# Patient Record
Sex: Male | Born: 1950
Health system: Southern US, Community
[De-identification: ages and names within clinical notes are randomized; demographics above are authoritative.]

## PROBLEM LIST (undated history)

## (undated) HISTORY — PX: APPENDECTOMY: SHX54

---

## 2007-09-28 ENCOUNTER — Emergency Department (HOSPITAL_COMMUNITY): Admission: EM | Admit: 2007-09-28 | Discharge: 2007-09-28 | Payer: Self-pay | Admitting: Emergency Medicine

## 2010-11-28 LAB — URINALYSIS, ROUTINE W REFLEX MICROSCOPIC
Bilirubin Urine: NEGATIVE
Glucose, UA: NEGATIVE
Hgb urine dipstick: NEGATIVE
Ketones, ur: 15 — AB
Nitrite: NEGATIVE
Protein, ur: NEGATIVE
Specific Gravity, Urine: 1.005
Urobilinogen, UA: 0.2
pH: 7

## 2010-11-28 LAB — POCT I-STAT, CHEM 8
BUN: 15
Calcium, Ion: 1.15
Chloride: 105
Creatinine, Ser: 1
Glucose, Bld: 113 — ABNORMAL HIGH
HCT: 43
Hemoglobin: 14.6
Potassium: 3.4 — ABNORMAL LOW
Sodium: 140
TCO2: 25

## 2016-01-03 ENCOUNTER — Ambulatory Visit (INDEPENDENT_AMBULATORY_CARE_PROVIDER_SITE_OTHER): Payer: Self-pay | Admitting: Physician Assistant

## 2016-01-03 VITALS — BP 130/80 | HR 83 | Temp 98.2°F | Resp 17 | Ht 68.5 in | Wt 212.0 lb

## 2016-01-03 DIAGNOSIS — Z0289 Encounter for other administrative examinations: Secondary | ICD-10-CM

## 2016-01-03 NOTE — Patient Instructions (Signed)
     IF you received an x-ray today, you will receive an invoice from [facility]. Please contact [facility] at [phone] with questions or concerns regarding your invoice.   IF you received labwork today, you will receive an invoice from [facility]. Please contact [facility] at [phone] with questions or concerns regarding your invoice.   Our billing staff will not be able to assist you with questions regarding bills from these companies.  You will be contacted with the lab results as soon as they are available. The fastest way to get your results is to activate your My Chart account. Instructions are located on the last page of this paperwork. If you have not heard from us regarding the results in 2 weeks, please contact this office.      

## 2016-01-03 NOTE — Progress Notes (Signed)
Commercial Driver Medical Examination   Joel Orozco is a 65 y.o. male who presents today for a commercial driver fitness determination physical exam. The patient reports no problems today. In the past the patient reports receiving 2 year certificates. He denies focal neurological deficits, vision and hearing changes. He denies the habitual use of benzodiazepines and opioids.  No past medical history on file.  Current medications, family history, allergies, social history reviewed by me and exist elsewhere in the encounter.   Review of Systems  Constitutional: Negative for fever.  Respiratory: Negative for shortness of breath.   Cardiovascular: Negative for chest pain and leg swelling.  Skin: Negative for rash.  Neurological: Negative for dizziness, tingling, tremors, sensory change, speech change, focal weakness, seizures, loss of consciousness and headaches.    Objective:     Vision/hearing:  Visual Acuity Screening   Right eye Left eye Both eyes  Without correction:     With correction: 20/13 20/15 20/13   Comments: Peripheral Vision: Right eye 85  degrees. Left eye 85 degrees.The patient can distinguish the colors red, amber and green.  Hearing Screening Comments: The patient was able to hear a forced whisper from 10 feet.  Urine: 1.015, negative for sugar, blood, protein  Applicant can recognize and distinguish among traffic control signals and devices showing standard red, green, and amber colors.  Corrective lenses required: Yes  Monocular Vision?: No  Hearing aid requirement: No  Physical Exam  Constitutional: He is oriented to person, place, and time. He appears well-developed and well-nourished. No distress.  HENT:  Right Ear: External ear normal.  Left Ear: External ear normal.  Nose: Nose normal.  Mouth/Throat: Oropharynx is clear and moist. No oropharyngeal exudate.  Eyes: Conjunctivae and EOM are normal. Pupils are equal, round, and reactive to light.   Cardiovascular: Normal rate, regular rhythm and normal heart sounds.   Pulmonary/Chest: Effort normal and breath sounds normal. He has no wheezes. He has no rales.  Musculoskeletal: Normal range of motion. He exhibits no edema, tenderness or deformity.  Neurological: He is alert and oriented to person, place, and time. He displays normal reflexes. No cranial nerve deficit. He exhibits normal muscle tone. Coordination normal.  Skin: Skin is warm and dry. He is not diaphoretic.  Psychiatric: He has a normal mood and affect.    BP 130/80 (BP Location: Left Arm, Cuff Size: Large)   Pulse 83   Temp 98.2 F (36.8 C) (Oral)   Resp 17   Ht 5' 8.5" (1.74 m)   Wt 212 lb (96.2 kg)   SpO2 97%   BMI 31.77 kg/m   Labs: Comments: UA: SG-1.015, Glu-Neg, Blo-Neg, Pro-Neg  Assessment:    Healthy male exam.  Meets standards in 84 CFR 391.41;  qualifies for 2 year certificate.    Plan:    Medical examiners certificate completed and printed. Return as needed.

## 2017-03-23 DIAGNOSIS — N529 Male erectile dysfunction, unspecified: Secondary | ICD-10-CM | POA: Diagnosis not present

## 2017-03-23 DIAGNOSIS — R030 Elevated blood-pressure reading, without diagnosis of hypertension: Secondary | ICD-10-CM | POA: Diagnosis not present

## 2017-03-23 DIAGNOSIS — R739 Hyperglycemia, unspecified: Secondary | ICD-10-CM | POA: Diagnosis not present

## 2017-03-23 DIAGNOSIS — R972 Elevated prostate specific antigen [PSA]: Secondary | ICD-10-CM | POA: Diagnosis not present

## 2017-03-23 DIAGNOSIS — Z0000 Encounter for general adult medical examination without abnormal findings: Secondary | ICD-10-CM | POA: Diagnosis not present

## 2017-03-23 DIAGNOSIS — Z23 Encounter for immunization: Secondary | ICD-10-CM | POA: Diagnosis not present

## 2017-03-23 DIAGNOSIS — E785 Hyperlipidemia, unspecified: Secondary | ICD-10-CM | POA: Diagnosis not present

## 2017-09-21 DIAGNOSIS — R739 Hyperglycemia, unspecified: Secondary | ICD-10-CM | POA: Diagnosis not present

## 2017-09-21 DIAGNOSIS — E785 Hyperlipidemia, unspecified: Secondary | ICD-10-CM | POA: Diagnosis not present

## 2017-09-21 DIAGNOSIS — R030 Elevated blood-pressure reading, without diagnosis of hypertension: Secondary | ICD-10-CM | POA: Diagnosis not present

## 2017-09-21 DIAGNOSIS — R972 Elevated prostate specific antigen [PSA]: Secondary | ICD-10-CM | POA: Diagnosis not present

## 2018-02-05 DIAGNOSIS — R69 Illness, unspecified: Secondary | ICD-10-CM | POA: Diagnosis not present

## 2018-04-05 DIAGNOSIS — Z0000 Encounter for general adult medical examination without abnormal findings: Secondary | ICD-10-CM | POA: Diagnosis not present

## 2018-04-05 DIAGNOSIS — E785 Hyperlipidemia, unspecified: Secondary | ICD-10-CM | POA: Diagnosis not present

## 2018-04-05 DIAGNOSIS — L609 Nail disorder, unspecified: Secondary | ICD-10-CM | POA: Diagnosis not present

## 2018-04-05 DIAGNOSIS — R739 Hyperglycemia, unspecified: Secondary | ICD-10-CM | POA: Diagnosis not present

## 2018-04-05 DIAGNOSIS — R972 Elevated prostate specific antigen [PSA]: Secondary | ICD-10-CM | POA: Diagnosis not present

## 2018-04-05 DIAGNOSIS — Z23 Encounter for immunization: Secondary | ICD-10-CM | POA: Diagnosis not present

## 2018-04-29 DIAGNOSIS — L603 Nail dystrophy: Secondary | ICD-10-CM | POA: Diagnosis not present

## 2018-04-29 DIAGNOSIS — C44519 Basal cell carcinoma of skin of other part of trunk: Secondary | ICD-10-CM | POA: Diagnosis not present

## 2018-04-29 DIAGNOSIS — L609 Nail disorder, unspecified: Secondary | ICD-10-CM | POA: Diagnosis not present

## 2018-04-29 DIAGNOSIS — L570 Actinic keratosis: Secondary | ICD-10-CM | POA: Diagnosis not present

## 2018-04-29 DIAGNOSIS — D225 Melanocytic nevi of trunk: Secondary | ICD-10-CM | POA: Diagnosis not present

## 2018-12-15 DIAGNOSIS — Z20828 Contact with and (suspected) exposure to other viral communicable diseases: Secondary | ICD-10-CM | POA: Diagnosis not present

## 2019-04-13 DIAGNOSIS — R739 Hyperglycemia, unspecified: Secondary | ICD-10-CM | POA: Diagnosis not present

## 2019-04-13 DIAGNOSIS — Z0000 Encounter for general adult medical examination without abnormal findings: Secondary | ICD-10-CM | POA: Diagnosis not present

## 2019-04-13 DIAGNOSIS — Z1211 Encounter for screening for malignant neoplasm of colon: Secondary | ICD-10-CM | POA: Diagnosis not present

## 2019-04-13 DIAGNOSIS — R972 Elevated prostate specific antigen [PSA]: Secondary | ICD-10-CM | POA: Diagnosis not present

## 2019-04-13 DIAGNOSIS — E785 Hyperlipidemia, unspecified: Secondary | ICD-10-CM | POA: Diagnosis not present

## 2019-04-24 DIAGNOSIS — Z85828 Personal history of other malignant neoplasm of skin: Secondary | ICD-10-CM | POA: Diagnosis not present

## 2019-04-24 DIAGNOSIS — D224 Melanocytic nevi of scalp and neck: Secondary | ICD-10-CM | POA: Diagnosis not present

## 2019-04-24 DIAGNOSIS — D225 Melanocytic nevi of trunk: Secondary | ICD-10-CM | POA: Diagnosis not present

## 2019-04-24 DIAGNOSIS — D2262 Melanocytic nevi of left upper limb, including shoulder: Secondary | ICD-10-CM | POA: Diagnosis not present

## 2019-04-24 DIAGNOSIS — C44619 Basal cell carcinoma of skin of left upper limb, including shoulder: Secondary | ICD-10-CM | POA: Diagnosis not present

## 2019-04-24 DIAGNOSIS — L918 Other hypertrophic disorders of the skin: Secondary | ICD-10-CM | POA: Diagnosis not present

## 2019-04-24 DIAGNOSIS — L821 Other seborrheic keratosis: Secondary | ICD-10-CM | POA: Diagnosis not present

## 2019-04-24 DIAGNOSIS — D2261 Melanocytic nevi of right upper limb, including shoulder: Secondary | ICD-10-CM | POA: Diagnosis not present

## 2019-04-24 DIAGNOSIS — D1801 Hemangioma of skin and subcutaneous tissue: Secondary | ICD-10-CM | POA: Diagnosis not present

## 2019-04-24 DIAGNOSIS — L820 Inflamed seborrheic keratosis: Secondary | ICD-10-CM | POA: Diagnosis not present

## 2019-04-24 DIAGNOSIS — L910 Hypertrophic scar: Secondary | ICD-10-CM | POA: Diagnosis not present

## 2019-07-15 DIAGNOSIS — Z20828 Contact with and (suspected) exposure to other viral communicable diseases: Secondary | ICD-10-CM | POA: Diagnosis not present

## 2019-07-15 DIAGNOSIS — Z03818 Encounter for observation for suspected exposure to other biological agents ruled out: Secondary | ICD-10-CM | POA: Diagnosis not present

## 2019-10-27 DIAGNOSIS — Z135 Encounter for screening for eye and ear disorders: Secondary | ICD-10-CM | POA: Diagnosis not present

## 2019-10-27 DIAGNOSIS — H524 Presbyopia: Secondary | ICD-10-CM | POA: Diagnosis not present

## 2020-03-06 DIAGNOSIS — H6122 Impacted cerumen, left ear: Secondary | ICD-10-CM | POA: Diagnosis not present

## 2020-03-06 DIAGNOSIS — R42 Dizziness and giddiness: Secondary | ICD-10-CM | POA: Diagnosis not present

## 2020-03-06 DIAGNOSIS — H81399 Other peripheral vertigo, unspecified ear: Secondary | ICD-10-CM | POA: Diagnosis not present

## 2020-04-19 DIAGNOSIS — H903 Sensorineural hearing loss, bilateral: Secondary | ICD-10-CM | POA: Diagnosis not present

## 2020-04-19 DIAGNOSIS — R42 Dizziness and giddiness: Secondary | ICD-10-CM | POA: Diagnosis not present

## 2020-04-19 DIAGNOSIS — H9193 Unspecified hearing loss, bilateral: Secondary | ICD-10-CM | POA: Diagnosis not present

## 2020-04-24 DIAGNOSIS — D225 Melanocytic nevi of trunk: Secondary | ICD-10-CM | POA: Diagnosis not present

## 2020-04-24 DIAGNOSIS — L910 Hypertrophic scar: Secondary | ICD-10-CM | POA: Diagnosis not present

## 2020-04-24 DIAGNOSIS — L570 Actinic keratosis: Secondary | ICD-10-CM | POA: Diagnosis not present

## 2020-04-24 DIAGNOSIS — L821 Other seborrheic keratosis: Secondary | ICD-10-CM | POA: Diagnosis not present

## 2020-04-24 DIAGNOSIS — Z85828 Personal history of other malignant neoplasm of skin: Secondary | ICD-10-CM | POA: Diagnosis not present

## 2020-04-24 DIAGNOSIS — L820 Inflamed seborrheic keratosis: Secondary | ICD-10-CM | POA: Diagnosis not present

## 2020-04-30 DIAGNOSIS — Z1389 Encounter for screening for other disorder: Secondary | ICD-10-CM | POA: Diagnosis not present

## 2020-04-30 DIAGNOSIS — Z0000 Encounter for general adult medical examination without abnormal findings: Secondary | ICD-10-CM | POA: Diagnosis not present

## 2020-04-30 DIAGNOSIS — R972 Elevated prostate specific antigen [PSA]: Secondary | ICD-10-CM | POA: Diagnosis not present

## 2020-04-30 DIAGNOSIS — R739 Hyperglycemia, unspecified: Secondary | ICD-10-CM | POA: Diagnosis not present

## 2020-04-30 DIAGNOSIS — E785 Hyperlipidemia, unspecified: Secondary | ICD-10-CM | POA: Diagnosis not present

## 2020-05-08 ENCOUNTER — Other Ambulatory Visit: Payer: Self-pay

## 2020-05-08 ENCOUNTER — Observation Stay (HOSPITAL_COMMUNITY)
Admission: EM | Admit: 2020-05-08 | Discharge: 2020-05-09 | Disposition: A | Discharge status: Home / Self Care | Payer: Medicare HMO | Attending: Internal Medicine | Admitting: Internal Medicine

## 2020-05-08 ENCOUNTER — Encounter (HOSPITAL_COMMUNITY): Payer: Self-pay | Admitting: Emergency Medicine

## 2020-05-08 ENCOUNTER — Emergency Department (HOSPITAL_COMMUNITY): Payer: Medicare HMO

## 2020-05-08 DIAGNOSIS — Z7982 Long term (current) use of aspirin: Secondary | ICD-10-CM | POA: Diagnosis not present

## 2020-05-08 DIAGNOSIS — R109 Unspecified abdominal pain: Secondary | ICD-10-CM | POA: Diagnosis not present

## 2020-05-08 DIAGNOSIS — I700 Atherosclerosis of aorta: Secondary | ICD-10-CM | POA: Insufficient documentation

## 2020-05-08 DIAGNOSIS — E872 Acidosis: Secondary | ICD-10-CM | POA: Diagnosis not present

## 2020-05-08 DIAGNOSIS — Z20822 Contact with and (suspected) exposure to covid-19: Secondary | ICD-10-CM | POA: Insufficient documentation

## 2020-05-08 DIAGNOSIS — D72829 Elevated white blood cell count, unspecified: Secondary | ICD-10-CM | POA: Insufficient documentation

## 2020-05-08 DIAGNOSIS — Z79899 Other long term (current) drug therapy: Secondary | ICD-10-CM | POA: Diagnosis not present

## 2020-05-08 DIAGNOSIS — K56600 Partial intestinal obstruction, unspecified as to cause: Secondary | ICD-10-CM

## 2020-05-08 DIAGNOSIS — K56699 Other intestinal obstruction unspecified as to partial versus complete obstruction: Secondary | ICD-10-CM | POA: Diagnosis not present

## 2020-05-08 DIAGNOSIS — R1030 Lower abdominal pain, unspecified: Secondary | ICD-10-CM | POA: Diagnosis not present

## 2020-05-08 DIAGNOSIS — E785 Hyperlipidemia, unspecified: Secondary | ICD-10-CM | POA: Insufficient documentation

## 2020-05-08 DIAGNOSIS — K56609 Unspecified intestinal obstruction, unspecified as to partial versus complete obstruction: Secondary | ICD-10-CM

## 2020-05-08 DIAGNOSIS — D696 Thrombocytopenia, unspecified: Secondary | ICD-10-CM | POA: Diagnosis not present

## 2020-05-08 DIAGNOSIS — Z6832 Body mass index (BMI) 32.0-32.9, adult: Secondary | ICD-10-CM | POA: Diagnosis not present

## 2020-05-08 DIAGNOSIS — E669 Obesity, unspecified: Secondary | ICD-10-CM | POA: Insufficient documentation

## 2020-05-08 DIAGNOSIS — R933 Abnormal findings on diagnostic imaging of other parts of digestive tract: Secondary | ICD-10-CM | POA: Diagnosis not present

## 2020-05-08 LAB — URINALYSIS, ROUTINE W REFLEX MICROSCOPIC
Bilirubin Urine: NEGATIVE
Glucose, UA: NEGATIVE mg/dL
Hgb urine dipstick: NEGATIVE
Ketones, ur: 5 mg/dL — AB
Leukocytes,Ua: NEGATIVE
Nitrite: NEGATIVE
Protein, ur: NEGATIVE mg/dL
Specific Gravity, Urine: 1.01 (ref 1.005–1.030)
pH: 5 (ref 5.0–8.0)

## 2020-05-08 LAB — COMPREHENSIVE METABOLIC PANEL
ALT: 28 U/L (ref 0–44)
AST: 26 U/L (ref 15–41)
Albumin: 4.8 g/dL (ref 3.5–5.0)
Alkaline Phosphatase: 92 U/L (ref 38–126)
Anion gap: 15 (ref 5–15)
BUN: 20 mg/dL (ref 8–23)
CO2: 20 mmol/L — ABNORMAL LOW (ref 22–32)
Calcium: 10.1 mg/dL (ref 8.9–10.3)
Chloride: 100 mmol/L (ref 98–111)
Creatinine, Ser: 1.1 mg/dL (ref 0.61–1.24)
GFR, Estimated: >60 mL/min (ref >60)
Glucose, Bld: 152 mg/dL — ABNORMAL HIGH (ref 70–99)
Potassium: 4 mmol/L (ref 3.5–5.1)
Sodium: 135 mmol/L (ref 135–145)
Total Bilirubin: 1.5 mg/dL — ABNORMAL HIGH (ref 0.3–1.2)
Total Protein: 7.9 g/dL (ref 6.5–8.1)

## 2020-05-08 LAB — RESP PANEL BY RT-PCR (FLU A&B, COVID) ARPGX2
Influenza A by PCR: NEGATIVE
Influenza B by PCR: NEGATIVE
SARS Coronavirus 2 by RT PCR: NEGATIVE

## 2020-05-08 LAB — CBC WITH DIFFERENTIAL/PLATELET
Abs Immature Granulocytes: 0.05 10*3/uL (ref 0.00–0.07)
Basophils Absolute: 0 10*3/uL (ref 0.0–0.1)
Basophils Relative: 0 %
Eosinophils Absolute: 0 10*3/uL (ref 0.0–0.5)
Eosinophils Relative: 0 %
HCT: 50.9 % (ref 39.0–52.0)
Hemoglobin: 17.6 g/dL — ABNORMAL HIGH (ref 13.0–17.0)
Immature Granulocytes: 0 %
Lymphocytes Relative: 9 %
Lymphs Abs: 1.2 10*3/uL (ref 0.7–4.0)
MCH: 30.7 pg (ref 26.0–34.0)
MCHC: 34.6 g/dL (ref 30.0–36.0)
MCV: 88.7 fL (ref 80.0–100.0)
Monocytes Absolute: 1.5 10*3/uL — ABNORMAL HIGH (ref 0.1–1.0)
Monocytes Relative: 11 %
Neutro Abs: 10.2 10*3/uL — ABNORMAL HIGH (ref 1.7–7.7)
Neutrophils Relative %: 80 %
Platelets: 213 10*3/uL (ref 150–400)
RBC: 5.74 MIL/uL (ref 4.22–5.81)
RDW: 12.6 % (ref 11.5–15.5)
WBC: 13 10*3/uL — ABNORMAL HIGH (ref 4.0–10.5)
nRBC: 0 % (ref 0.0–0.2)

## 2020-05-08 LAB — LIPID PANEL
Cholesterol: 219 mg/dL — ABNORMAL HIGH (ref 0–200)
HDL: 44 mg/dL (ref >40)
LDL Cholesterol: 154 mg/dL — ABNORMAL HIGH (ref 0–99)
Total CHOL/HDL Ratio: 5 RATIO
Triglycerides: 107 mg/dL (ref <150)
VLDL: 21 mg/dL (ref 0–40)

## 2020-05-08 LAB — HIV ANTIBODY (ROUTINE TESTING W REFLEX): HIV Screen 4th Generation wRfx: NONREACTIVE

## 2020-05-08 LAB — POC OCCULT BLOOD, ED: Fecal Occult Bld: NEGATIVE

## 2020-05-08 LAB — LIPASE, BLOOD: Lipase: 26 U/L (ref 11–51)

## 2020-05-08 MED ORDER — ENOXAPARIN SODIUM 40 MG/0.4ML ~~LOC~~ SOLN
40.0000 mg | SUBCUTANEOUS | Status: DC
  Administered 2020-05-08: 40 mg via SUBCUTANEOUS
  Filled 2020-05-08: qty 0.4

## 2020-05-08 MED ORDER — MORPHINE SULFATE (PF) 4 MG/ML IV SOLN
4.0000 mg | Freq: Once | INTRAVENOUS | Status: AC
  Administered 2020-05-08: 4 mg via INTRAVENOUS
  Filled 2020-05-08: qty 1

## 2020-05-08 MED ORDER — MORPHINE SULFATE (PF) 2 MG/ML IV SOLN: 2.0000 mg | INTRAVENOUS | Status: DC | PRN

## 2020-05-08 MED ORDER — SODIUM CHLORIDE 0.9 % IV BOLUS
1000.0000 mL | Freq: Once | INTRAVENOUS | Status: AC
  Administered 2020-05-08: 1000 mL via INTRAVENOUS

## 2020-05-08 MED ORDER — ONDANSETRON HCL 4 MG/2ML IJ SOLN
4.0000 mg | Freq: Once | INTRAMUSCULAR | Status: AC
  Administered 2020-05-08: 4 mg via INTRAVENOUS
  Filled 2020-05-08: qty 2

## 2020-05-08 MED ORDER — ONDANSETRON HCL 4 MG PO TABS: 4.0000 mg | ORAL_TABLET | Freq: Four times a day (QID) | ORAL | Status: DC | PRN

## 2020-05-08 MED ORDER — LACTATED RINGERS IV SOLN: INTRAVENOUS | Status: AC

## 2020-05-08 MED ORDER — IOHEXOL 300 MG/ML  SOLN
100.0000 mL | Freq: Once | INTRAMUSCULAR | Status: AC | PRN
  Administered 2020-05-08: 100 mL via INTRAVENOUS

## 2020-05-08 MED ORDER — ONDANSETRON HCL 4 MG/2ML IJ SOLN: 4.0000 mg | Freq: Four times a day (QID) | INTRAMUSCULAR | Status: DC | PRN

## 2020-05-08 NOTE — ED Triage Notes (Signed)
Pt arrives with c/o gallbladder issues. Pt sts lower abdominal pain that radiates to umbilicus region and diarrhea since yesterday. Nauseated.

## 2020-05-08 NOTE — ED Provider Notes (Signed)
[facility] DEPT Provider Note   CSN: [id number] Arrival date & time: 05/08/20  0559     History Chief Complaint  Patient presents with  . Abdominal Pain    Joel Orozco is a 70 y.o. male presenting for evaluation of abdominal pain, nausea, vomiting.  Patient states his symptoms began yesterday.  He developed right-sided abdominal pain that improved slightly when he had a bowel movement yesterday.  However he has been unable to tolerate p.o. since yesterday afternoon, and he has had constant pain since dinner last night.  He vomited x6 last night, is yellowish/watery.  He is belching more.  No blood in his emesis.  He had one episode of diarrhea today, it was nonbloody.  He reports hot and cold, but no known fevers.  He took Tylenol and Tums yesterday with minimal improvement.  He denies cough, chest pain, shortness of breath, or urinary symptoms.  No previous history of similar symptoms.  He does have a history of appendectomy.  He follows regularly with a primary care doctor, however has no medical problems and takes no medications daily.  He has got regular colonoscopies, is due for one soon.  He has never had any significant abnormalities on his colonoscopies other than polyps.   HPI     History reviewed. No pertinent past medical history.  There are no problems to display for this patient.   Past Surgical History:  Procedure Laterality Date  . APPENDECTOMY         No family history on file.  Social History   Tobacco Use  . Smoking status: Never Smoker  . Smokeless tobacco: Never Used  Substance Use Topics  . Alcohol use: No  . Drug use: No    Home Medications Prior to Admission medications   Medication Sig Start Date End Date Taking? Authorizing Provider  aspirin EC 81 MG tablet Take 81 mg by mouth daily.   Yes [provider]  cetirizine (ALLERGY, CETIRIZINE,) 10 MG tablet Take 10 mg by mouth daily.   Yes [provider]  ibuprofen (ADVIL) 200 MG tablet Take 400 mg by mouth every 4 (four) hours as needed for headache or mild pain.   Yes [provider]  Multiple Vitamins-Minerals (MULTIVITAMIN WITH MINERALS) tablet Take 1 tablet by mouth daily.   Yes [provider]  Turmeric (QC TUMERIC COMPLEX) 500 MG CAPS Take 1 capsule by mouth daily.   Yes [provider]    Allergies    Patient has no known allergies.  Review of Systems   Review of Systems  Gastrointestinal: Positive for abdominal pain, diarrhea, nausea and vomiting.  All other systems reviewed and are negative.   Physical Exam Updated Vital Signs BP 139/81   Pulse 88   Temp 98 F (36.7 C)   Resp 14   SpO2 94%   Physical Exam Vitals and nursing note reviewed.  Constitutional:      General: He is not in acute distress.    Appearance: He is well-developed and well-nourished.     Comments: Appears nontoxic, but uncomfortable due to pain.   HENT:     Head: Normocephalic and atraumatic.  Eyes:     Extraocular Movements: EOM normal.     Conjunctiva/sclera: Conjunctivae normal.     Pupils: Pupils are equal, round, and reactive to light.  Cardiovascular:     Rate and Rhythm: Normal rate and regular rhythm.     Pulses: Intact distal pulses.  Pulmonary:  Effort: Pulmonary effort is normal. No respiratory distress.     Breath sounds: Normal breath sounds. No wheezing.  Abdominal:     General: There is no distension.     Palpations: Abdomen is soft.     Tenderness: There is abdominal tenderness. There is no guarding or rebound.     Comments: TTP of lower abd, worse on the R side. Firmness noted in the RLQ.  No rigidity or distention.  Negative rebound.  No peritonitis.  Musculoskeletal:        General: Normal range of motion.     Cervical back: Normal range of motion and neck supple.  Skin:    General: Skin is warm and dry.     Capillary Refill: Capillary refill takes less than 2 seconds.   Neurological:     Mental Status: He is alert and oriented to person, place, and time.  Psychiatric:        Mood and Affect: Mood and affect normal.     ED Results / Procedures / Treatments   Labs (all labs ordered are listed, but only abnormal results are displayed) Labs Reviewed  CBC WITH DIFFERENTIAL/PLATELET - Abnormal; Notable for the following components:      Result Value   WBC 13.0 (*)    Hemoglobin 17.6 (*)    Neutro Abs 10.2 (*)    Monocytes Absolute 1.5 (*)    All other components within normal limits  COMPREHENSIVE METABOLIC PANEL - Abnormal; Notable for the following components:   CO2 20 (*)    Glucose, Bld 152 (*)    Total Bilirubin 1.5 (*)    All other components within normal limits  LIPASE, BLOOD  URINALYSIS, ROUTINE W REFLEX MICROSCOPIC    EKG None  Radiology CT ABDOMEN PELVIS W CONTRAST  Result Date: 05/08/2020 CLINICAL DATA:  Lower abdominal pain, known gallbladder issues, EXAM: CT ABDOMEN AND PELVIS WITH CONTRAST TECHNIQUE: Multidetector CT imaging of the abdomen and pelvis was performed using the standard protocol following bolus administration of intravenous contrast. CONTRAST:  122mL OMNIPAQUE IOHEXOL 300 MG/ML  SOLN COMPARISON:  None. FINDINGS: Lower chest: No acute abnormality. Hepatobiliary: No solid liver abnormality is seen. No gallstones, gallbladder wall thickening, or biliary dilatation. Pancreas: Unremarkable. No pancreatic ductal dilatation or surrounding inflammatory changes. Spleen: Normal in size without significant abnormality. Adrenals/Urinary Tract: Adrenal glands are unremarkable. Kidneys are normal, without renal calculi, solid lesion, or hydronephrosis. Thickening of the urinary bladder wall, likely due to chronic outlet obstruction. Stomach/Bowel: Stomach is within normal limits. Appendix is surgically absent. There are multiple thickened, strictured appearing segments of mid to distal bowel, in the ventral mid abdomen measuring  approximately 10 cm in length (series 3, image 38), in the right lower quadrant measuring approximately 5 cm in length (series 3, image 45) and involving the terminal ileum measuring at least 10 cm in length (series 5, image 66). There are multiple fluid-filled, mildly distended loops of distal small bowel, measuring up to 3.7 cm in caliber. The colon is fluid-filled to the sigmoid and generally decompressed distally. Vascular/Lymphatic: Aortic atherosclerosis. No enlarged abdominal or pelvic lymph nodes. Reproductive: Prostatomegaly. Other: No abdominal wall hernia or abnormality. Small volume ascites throughout the abdomen and pelvis, particularly in the right lower quadrant. Musculoskeletal: No acute or significant osseous findings. IMPRESSION: 1. There are multiple thickened, strictured appearing segments of mid to distal bowel as detailed above. This appearance is highly suspicious for Crohn's disease. 2. There are multiple fluid-filled, mildly distended loops of distal small  bowel proximal to these strictured segments, measuring up to 3.7 cm in caliber and suggesting partial small bowel obstruction. 3. The colon is fluid-filled to the sigmoid, in keeping with diarrheal illness. 4. Small volume ascites throughout the abdomen and pelvis, particularly in the right lower quadrant. 5. Prostatomegaly with thickening of the urinary bladder wall, likely due to chronic outlet obstruction. Aortic Atherosclerosis (ICD10-I70.0). Electronically Signed   By: Eddie Candle M.D.   On: 05/08/2020 08:15    Procedures Procedures   Medications Ordered in ED Medications  sodium chloride 0.9 % bolus 1,000 mL (1,000 mLs Intravenous New Bag/Given 05/08/20 0723)  morphine 4 MG/ML injection 4 mg (4 mg Intravenous Given 05/08/20 0725)  ondansetron (ZOFRAN) injection 4 mg (4 mg Intravenous Given 05/08/20 0724)  iohexol (OMNIPAQUE) 300 MG/ML solution 100 mL (100 mLs Intravenous Contrast Given 05/08/20 0736)    ED Course  I have  reviewed the triage vital signs and the nursing notes.  Pertinent labs & imaging results that were available during my care of the patient were reviewed by me and considered in my medical decision making (see chart for details).    MDM Rules/Calculators/A&P                          Patient presented for evaluation of nausea, vomiting, abdominal pain.  On exam, patient appears nontoxic, although he does appear uncomfortable due to pain.  He has tenderness palpation of his lower abdomen, worse in the right side with a firmness in the area.  Concern for diverticulitis versus viral GI illness versus possible obstruction versus pancreatitis versus gallbladder etiology.  Concern for kidney stone.  Less likely UTI as patient is without urinary symptoms.  Will obtain labs and CT abdomen pelvis for further evaluation.  Will treat symptomatically.  Labs interpreted by me, shows mild leukocytosis of 13, but otherwise reassuring.  CT abdomen pelvis shows partial small bowel obstruction.  Additionally, patient with strictures and inflammation concerning for possible Crohn's.  Patient will need to be admitted for the partial small bowel obstruction, will consult with GI as patient is due for colonoscopy and to do this abnormality on the CT.  Discussed with Dr. Cristina Gong from [facility], who consult on patient while he is admitted.  Discussed with Dr. 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

## 2020-05-08 NOTE — Consult Note (Signed)
Referring Provider: [facility] Primary Care Physician:  [facility] [place] Primary Gastroenterologist:  Dr. Paulita Fujita  Reason for Consultation: Lower abdominal pain, nausea, diarrhea    HPI: Joel Orozco is a 70 y.o. non-smoking male with history of hyperlipidemia, elevated PSA, status post appendectomy presents for 1 day of abdominal pain, diarrhea, nausea and vomiting.  Patient is known to Dr. Paulita Fujita, had colonoscopy 04/25/2009 with an 8 mm polyp in the cecum, hyperplastic polyp, recall 10 years. No known family history of colon cancer or inflammatory bowel disease.  Patient reports he was in good health prior to this episode.  After Tuesday morning bike ride, patient started to develop squeezing, lower abdominal discomfort, with abdominal fullness/gas. Patient started having decreased appetite that day with continuing abdominal discomfort into the night.  Patient started to have 2 episodes of vomiting starting at 10:30 PM Tuesday evening, describes large quantity brown food particle with yellow bile.  Patient had portable mushrooms and squash the night previous.  Patient had one episode of solid brown stool for 430 p.m., and then darker loose stool at 6 AM.  Patient denies melena, hematochezia.  Denies fever, chills. No sick contacts, no other family members ill.  Patient had mild leukocytosis white blood cell count 13.  No anemia hemoglobin 17.6, platelets 216, normal kidney and liver function. Total bilirubin 1.5.  Lipase 26.   CT abdomen pelvis with contrast showed multiple level thickening, strictures appearing segments of mid to distal small bowel and in the ileum, and multiple fluid-filled, mildly distended loops of distal small bowel proximal to the strictured segments likely representing partial small bowel obstruction.:  Fluid-filled and sigmoid keeping with diarrheal illness.  Small volume ascites throughout the abdomen particularly right lower  quadrant.   History reviewed. No pertinent past medical history.  Past Surgical History:  Procedure Laterality Date  . APPENDECTOMY      Prior to Admission medications   Medication Sig Start Date End Date Taking? Authorizing Provider  aspirin EC 81 MG tablet Take 81 mg by mouth daily.   Yes [provider]  cetirizine (ZYRTEC) 10 MG tablet Take 10 mg by mouth daily.   Yes [provider]  ibuprofen (ADVIL) 200 MG tablet Take 400 mg by mouth every 4 (four) hours as needed for headache or mild pain.   Yes [provider]  Multiple Vitamins-Minerals (MULTIVITAMIN WITH MINERALS) tablet Take 1 tablet by mouth daily.   Yes [provider]  Turmeric 500 MG CAPS Take 1 capsule by mouth daily.   Yes [provider]    Current Facility-Administered Medications  Medication Dose Route Frequency Provider Last Rate Last Admin  . enoxaparin (LOVENOX) injection 40 mg  40 mg Subcutaneous Q24H Harold Hedge, MD   40 mg at 05/08/20 1155  . lactated ringers infusion   Intravenous Continuous Harold Hedge, MD 100 mL/hr at 05/08/20 1034 New Bag at 05/08/20 1034  . morphine 2 MG/ML injection 2 mg  2 mg Intravenous Q2H PRN Harold Hedge, MD      . ondansetron [facility]) tablet 4 mg  4 mg Oral Q6H PRN Harold Hedge, MD       Or  . ondansetron [facility]) injection 4 mg  4 mg Intravenous Q6H PRN Harold Hedge, MD       Current Outpatient Medications  Medication Sig Dispense Refill  . aspirin EC 81 MG tablet Take 81 mg by mouth daily.    . cetirizine (ZYRTEC) 10  MG tablet Take 10 mg by mouth daily.    Marland Kitchen ibuprofen (ADVIL) 200 MG tablet Take 400 mg by mouth every 4 (four) hours as needed for headache or mild pain.    . Multiple Vitamins-Minerals (MULTIVITAMIN WITH MINERALS) tablet Take 1 tablet by mouth daily.    . Turmeric 500 MG CAPS Take 1 capsule by mouth daily.      Allergies as of 05/08/2020  . (No Known Allergies)    No family history on file.  Social  History   Socioeconomic History  . Marital status: Unknown    Spouse name: Not on file  . Number of children: Not on file  . Years of education: Not on file  . Highest education level: Not on file  Occupational History  . Not on file  Tobacco Use  . Smoking status: Never Smoker  . Smokeless tobacco: Never Used  Substance and Sexual Activity  . Alcohol use: No  . Drug use: No  . Sexual activity: Not on file  Other Topics Concern  . Not on file  Social History Narrative  . Not on file   Social Determinants of Health   Financial Resource Strain: Not on file  Food Insecurity: Not on file  Transportation Needs: Not on file  Physical Activity: Not on file  Stress: Not on file  Social Connections: Not on file  Intimate Partner Violence: Not on file    Review of Systems:  Review of Systems  Constitutional: Negative for chills, fever and weight loss.  Respiratory: Negative for cough and shortness of breath.   Cardiovascular: Negative for chest pain and leg swelling.  Gastrointestinal: Positive for abdominal pain, diarrhea, nausea and vomiting. Negative for blood in stool, constipation, heartburn and melena.  Genitourinary: Negative.        Decreased output with dehydation  Musculoskeletal: Negative for joint pain.  Skin: Negative.  Negative for rash.  Neurological: Negative for dizziness, tingling, sensory change and headaches.    Physical Exam: Vital signs in last 24 hours: Temp:  [98 F (36.7 C)] 98 F (36.7 C) (03/09 0609) Pulse Rate:  [84-98] 88 (03/09 0904) Resp:  [13-27] 14 (03/09 0904) BP: (113-143)/(75-85) 139/81 (03/09 0904) SpO2:  [94 %-100 %] 94 % (03/09 0904)   Dr. Cristina Gong performed physical exam and rectal exam.  Intake/Output from previous day: No intake/output data recorded. Intake/Output this shift: No intake/output data recorded.  Lab Results: Recent Labs    05/08/20 0619  WBC 13.0*  HGB 17.6*  HCT 50.9  PLT 213   BMET Recent Labs     05/08/20 0619  NA 135  K 4.0  CL 100  CO2 20*  GLUCOSE 152*  BUN 20  CREATININE 1.10  CALCIUM 10.1   LFT Recent Labs    05/08/20 0619  PROT 7.9  ALBUMIN 4.8  AST 26  ALT 28  ALKPHOS 92  BILITOT 1.5*   PT/INR No results for input(s): LABPROT, INR in the last 72 hours.  Studies/Results: CT ABDOMEN PELVIS W CONTRAST  Result Date: 05/08/2020 CLINICAL DATA:  Lower abdominal pain, known gallbladder issues, EXAM: CT ABDOMEN AND PELVIS WITH CONTRAST TECHNIQUE: Multidetector CT imaging of the abdomen and pelvis was performed using the standard protocol following bolus administration of intravenous contrast. CONTRAST:  147mL OMNIPAQUE IOHEXOL 300 MG/ML  SOLN COMPARISON:  None. FINDINGS: Lower chest: No acute abnormality. Hepatobiliary: No solid liver abnormality is seen. No gallstones, gallbladder wall thickening, or biliary dilatation. Pancreas: Unremarkable. No pancreatic ductal dilatation or  surrounding inflammatory changes. Spleen: Normal in size without significant abnormality. Adrenals/Urinary Tract: Adrenal glands are unremarkable. Kidneys are normal, without renal calculi, solid lesion, or hydronephrosis. Thickening of the urinary bladder wall, likely due to chronic outlet obstruction. Stomach/Bowel: Stomach is within normal limits. Appendix is surgically absent. There are multiple thickened, strictured appearing segments of mid to distal bowel, in the ventral mid abdomen measuring approximately 10 cm in length (series 3, image 38), in the right lower quadrant measuring approximately 5 cm in length (series 3, image 45) and involving the terminal ileum measuring at least 10 cm in length (series 5, image 66). There are multiple fluid-filled, mildly distended loops of distal small bowel, measuring up to 3.7 cm in caliber. The colon is fluid-filled to the sigmoid and generally decompressed distally. Vascular/Lymphatic: Aortic atherosclerosis. No enlarged abdominal or pelvic lymph nodes.  Reproductive: Prostatomegaly. Other: No abdominal wall hernia or abnormality. Small volume ascites throughout the abdomen and pelvis, particularly in the right lower quadrant. Musculoskeletal: No acute or significant osseous findings. IMPRESSION: 1. There are multiple thickened, strictured appearing segments of mid to distal bowel as detailed above. This appearance is highly suspicious for Crohn's disease. 2. There are multiple fluid-filled, mildly distended loops of distal small bowel proximal to these strictured segments, measuring up to 3.7 cm in caliber and suggesting partial small bowel obstruction. 3. The colon is fluid-filled to the sigmoid, in keeping with diarrheal illness. 4. Small volume ascites throughout the abdomen and pelvis, particularly in the right lower quadrant. 5. Prostatomegaly with thickening of the urinary bladder wall, likely due to chronic outlet obstruction. Aortic Atherosclerosis (ICD10-I70.0). Electronically Signed   By: Eddie Candle M.D.   On: 05/08/2020 08:15    Impression: 1.  Enteritis- Patient with normal colonoscopy 2011, due for follow up colonoscopy this year. No known IBD history for the patient.  Patient with no prodrome or AB history prior to this episode, and coupled with abrupt nature more likely infectious.  2.  Partial small bowel obstruction secondary to 1.   Plan: 1. Will continue supportive care at this time. Continue IV fluids and NPO at this time, advancing diet as tolerated.   2. Will order GI pathogen panel if able to obtain sample.   3. Will schedule for outpatient colonoscopy and potential endoscopy unless indicated sooner.    LOS: 0 days   Vladimir Crofts  05/08/2020, 12:12 PM   Pager [phone] If no answer or after 5 PM call [phone]

## 2020-05-08 NOTE — H&P (Signed)
History and Physical        Hospital Admission Note Date: 05/08/2020  Patient name: Joel Orozco Medical record number: [id number] Date of birth: 1950/04/20 Age: 70 y.o. Gender: male  PCP: [facility] [place] Complaint    Chief Complaint  Patient presents with  . Abdominal Pain      HPI:   This is a 70 year old male with a past medical history of appendectomy (in the 90s) who presented to the ED with lower abdominal pain, nausea and diarrhea since yesterday.  States he was in his usual state of health until yesterday morning when he started with lower abdominal pain described as similar to when he needed his appendectomy.  Discomfort continued throughout the day and had decreased p.o. intake with some nausea and retching but no vomiting initially.  Was able to have a loose but dark BM in the afternoon and symptoms subsided throughout the afternoon but returned yesterday evening after going to dinner with friends.  He only ate a small amount of food.  Throughout the night the patient had significant abdominal pain nausea, retching with some vomiting and diarrhea.  Denies personal or family history of Crohn's or ulcerative colitis.  Denies tobacco or alcohol use.  Typically very active and only takes aspirin for cardiovascular prophylaxis per his PCP and not for treatment.  Wife states he has been under a great amount of stress for the past several months after having persistent vertigo since December with an unremarkable ENT work-up outpatient and Covid at the beginning of February.   ED Course: Afebrile, hemodynamically stable, on room air. Notable Labs: Lipase 26, AST 26, ALT 28, T bili 1.5, WBC 13.0, Hb 17.6, platelets 213. Notable Imaging: CT abdomen pelvis with contrast-multiple thickened appearing segments of mid to distal bowel, appearance highly  suspicious for Crohn's disease; multiple fluid-filled, mildly distended loops of distal small bowel measuring up to 3.7 cm in caliber suggesting partial SBO,: Fluid-filled to sigmoid consistent with diarrhea, small volume ascites throughout abdomen pelvis particularly in RLQ and prostatomegaly with thickening of urinary bladder likely due to chronic outlet obstruction; aortic atherosclerosis. Patient received morphine, Zofran, 1 L NS bolus.  ED provider discussed with Dr. Cristina Gong, [facility], who will see in consult.   Vitals:   05/08/20 0900 05/08/20 0904  BP: 139/81 139/81  Pulse: 84 88  Resp: 17 14  Temp:    SpO2: 95% 94%     Review of Systems:  Review of Systems  All other systems reviewed and are negative.   Medical/Social/Family History   Past Medical History: History reviewed. No pertinent past medical history.  Past Surgical History:  Procedure Laterality Date  . APPENDECTOMY      Medications: Prior to Admission medications   Medication Sig Start Date End Date Taking? Authorizing Provider  aspirin EC 81 MG tablet Take 81 mg by mouth daily.   Yes [provider]  cetirizine (ALLERGY, CETIRIZINE,) 10 MG tablet Take 10 mg by mouth daily.   Yes [provider]  Multiple Vitamins-Minerals (MULTIVITAMIN WITH MINERALS) tablet Take 1 tablet by mouth daily.   Yes [provider]  Turmeric (QC TUMERIC COMPLEX) 500 MG CAPS Take  1 capsule by mouth daily.   Yes [provider]    Allergies:  No Known Allergies  Social History:  reports that he has never smoked. He has never used smokeless tobacco. He reports that he does not drink alcohol and does not use drugs.  Family History: No family history on file.   Objective   Physical Exam: Blood pressure 139/81, pulse 88, temperature 98 F (36.7 C), resp. rate 14, SpO2 94 %.  Physical Exam Vitals and nursing note reviewed.  Constitutional:      Appearance: Normal appearance.  HENT:      Head: Normocephalic and atraumatic.  Eyes:     Conjunctiva/sclera: Conjunctivae normal.  Cardiovascular:     Rate and Rhythm: Normal rate and regular rhythm.  Pulmonary:     Effort: Pulmonary effort is normal.     Breath sounds: Normal breath sounds.  Abdominal:     General: Bowel sounds are decreased. There is distension.     Comments: Some suprapubic abdominal discomfort to palpation  Musculoskeletal:        General: No swelling or tenderness.  Skin:    Coloration: Skin is not jaundiced or pale.  Neurological:     Mental Status: He is alert. Mental status is at baseline.  Psychiatric:        Mood and Affect: Mood normal.        Behavior: Behavior normal.     LABS on Admission: I have personally reviewed all the labs and imaging below    Basic Metabolic Panel: Recent Labs  Lab 05/08/20 0619  NA 135  K 4.0  CL 100  CO2 20*  GLUCOSE 152*  BUN 20  CREATININE 1.10  CALCIUM 10.1   Liver Function Tests: Recent Labs  Lab 05/08/20 0619  AST 26  ALT 28  ALKPHOS 92  BILITOT 1.5*  PROT 7.9  ALBUMIN 4.8   Recent Labs  Lab 05/08/20 0619  LIPASE 26   No results for input(s): AMMONIA in the last 168 hours. CBC: Recent Labs  Lab 05/08/20 0619  WBC 13.0*  NEUTROABS 10.2*  HGB 17.6*  HCT 50.9  MCV 88.7  PLT 213   Cardiac Enzymes: No results for input(s): CKTOTAL, CKMB, CKMBINDEX, TROPONINI in the last 168 hours. BNP: Invalid input(s): POCBNP CBG: No results for input(s): GLUCAP in the last 168 hours.  Radiological Exams on Admission:  CT ABDOMEN PELVIS W CONTRAST  Result Date: 05/08/2020 CLINICAL DATA:  Lower abdominal pain, known gallbladder issues, EXAM: CT ABDOMEN AND PELVIS WITH CONTRAST TECHNIQUE: Multidetector CT imaging of the abdomen and pelvis was performed using the standard protocol following bolus administration of intravenous contrast. CONTRAST:  173mL OMNIPAQUE IOHEXOL 300 MG/ML  SOLN COMPARISON:  None. FINDINGS: Lower chest: No acute  abnormality. Hepatobiliary: No solid liver abnormality is seen. No gallstones, gallbladder wall thickening, or biliary dilatation. Pancreas: Unremarkable. No pancreatic ductal dilatation or surrounding inflammatory changes. Spleen: Normal in size without significant abnormality. Adrenals/Urinary Tract: Adrenal glands are unremarkable. Kidneys are normal, without renal calculi, solid lesion, or hydronephrosis. Thickening of the urinary bladder wall, likely due to chronic outlet obstruction. Stomach/Bowel: Stomach is within normal limits. Appendix is surgically absent. There are multiple thickened, strictured appearing segments of mid to distal bowel, in the ventral mid abdomen measuring approximately 10 cm in length (series 3, image 38), in the right lower quadrant measuring approximately 5 cm in length (series 3, image 45) and involving the terminal ileum measuring at least 10 cm in length (series  5, image 66). There are multiple fluid-filled, mildly distended loops of distal small bowel, measuring up to 3.7 cm in caliber. The colon is fluid-filled to the sigmoid and generally decompressed distally. Vascular/Lymphatic: Aortic atherosclerosis. No enlarged abdominal or pelvic lymph nodes. Reproductive: Prostatomegaly. Other: No abdominal wall hernia or abnormality. Small volume ascites throughout the abdomen and pelvis, particularly in the right lower quadrant. Musculoskeletal: No acute or significant osseous findings. IMPRESSION: 1. There are multiple thickened, strictured appearing segments of mid to distal bowel as detailed above. This appearance is highly suspicious for Crohn's disease. 2. There are multiple fluid-filled, mildly distended loops of distal small bowel proximal to these strictured segments, measuring up to 3.7 cm in caliber and suggesting partial small bowel obstruction. 3. The colon is fluid-filled to the sigmoid, in keeping with diarrheal illness. 4. Small volume ascites throughout the abdomen and  pelvis, particularly in the right lower quadrant. 5. Prostatomegaly with thickening of the urinary bladder wall, likely due to chronic outlet obstruction. Aortic Atherosclerosis (ICD10-I70.0). Electronically Signed   By: Eddie Candle M.D.   On: 05/08/2020 08:15      EKG: Not done   A & P   Principal Problem:   SBO (small bowel obstruction) (HCC) Active Problems:   Aortic atherosclerosis ([place])    1. SBO, likely partial a. CT findings concerning for Crohn's disease and partial small bowel obstruction b. Currently not in acute distress c. No personal or family history of Crohn's.  Has had colonoscopies but no reports on file d. N.p.o. e. IV fluids f. GI consulted  2. Aortic atherosclerosis a. Noted on CT b. Lipid panel  3. Leukocytosis, likely reactive to #1    DVT prophylaxis: Lovenox   Code Status: Full Code  Diet: N.p.o. Family Communication: Admission, patients condition and plan of care including tests being ordered have been discussed with the patient who indicates understanding and agrees with the plan and Code Status. Patient's wife was updated  Disposition Plan: The appropriate patient status for this patient is OBSERVATION. Observation status is judged to be reasonable and necessary in order to provide the required intensity of service to ensure the patient's safety. The patient's presenting symptoms, physical exam findings, and initial radiographic and laboratory data in the context of their medical condition is felt to place them at decreased risk for further clinical deterioration. Furthermore, it is anticipated that the patient will be medically stable for discharge from the hospital within 2 midnights of admission. The following factors support the patient status of observation.   " The patient's presenting symptoms include abdominal pain, nausea and vomiting. " The physical exam findings include abdominal distention. " The initial radiographic and laboratory  data are partial small bowel obstruction.    Consultants  . GI  Procedures  . None  Time Spent on Admission: 53 minutes    Harold Hedge, DO [facility]  05/08/2020, 10:08 AM

## 2020-05-09 DIAGNOSIS — K56609 Unspecified intestinal obstruction, unspecified as to partial versus complete obstruction: Secondary | ICD-10-CM | POA: Diagnosis not present

## 2020-05-09 DIAGNOSIS — R933 Abnormal findings on diagnostic imaging of other parts of digestive tract: Secondary | ICD-10-CM | POA: Diagnosis not present

## 2020-05-09 DIAGNOSIS — I700 Atherosclerosis of aorta: Secondary | ICD-10-CM | POA: Diagnosis not present

## 2020-05-09 DIAGNOSIS — R1030 Lower abdominal pain, unspecified: Secondary | ICD-10-CM | POA: Diagnosis not present

## 2020-05-09 DIAGNOSIS — E785 Hyperlipidemia, unspecified: Secondary | ICD-10-CM

## 2020-05-09 DIAGNOSIS — E872 Acidosis: Secondary | ICD-10-CM

## 2020-05-09 LAB — CBC
HCT: 40.9 % (ref 39.0–52.0)
Hemoglobin: 13.4 g/dL (ref 13.0–17.0)
MCH: 30.5 pg (ref 26.0–34.0)
MCHC: 32.8 g/dL (ref 30.0–36.0)
MCV: 93 fL (ref 80.0–100.0)
Platelets: 147 10*3/uL — ABNORMAL LOW (ref 150–400)
RBC: 4.4 MIL/uL (ref 4.22–5.81)
RDW: 13 % (ref 11.5–15.5)
WBC: 6.4 10*3/uL (ref 4.0–10.5)
nRBC: 0 % (ref 0.0–0.2)

## 2020-05-09 LAB — BASIC METABOLIC PANEL
Anion gap: 10 (ref 5–15)
BUN: 17 mg/dL (ref 8–23)
CO2: 25 mmol/L (ref 22–32)
Calcium: 8.6 mg/dL — ABNORMAL LOW (ref 8.9–10.3)
Chloride: 106 mmol/L (ref 98–111)
Creatinine, Ser: 1.22 mg/dL (ref 0.61–1.24)
GFR, Estimated: >60 mL/min (ref >60)
Glucose, Bld: 96 mg/dL (ref 70–99)
Potassium: 4.4 mmol/L (ref 3.5–5.1)
Sodium: 141 mmol/L (ref 135–145)

## 2020-05-09 MED ORDER — ONDANSETRON HCL 4 MG PO TABS: 4.0000 mg | ORAL_TABLET | Freq: Four times a day (QID) | ORAL | 0 refills | Status: AC | PRN

## 2020-05-09 NOTE — Progress Notes (Addendum)
Blood work this morning shows improvement in the patient's white count, and appropriate drop in hemoglobin with hydration, suggestive of volume restitution.  GI pathogen panel is still pending.  Clinically, the patient is doing better.  He denies nausea, vomiting, abdominal pain, or diarrhea.  In fact, he has not had any bowel movements but I do believe he is passing gas.  He has tolerated clear liquids.  On exam, he is sitting up in bed in no distress.  He actually looks very healthy.  His wife is at the bedside.  The abdomen is nontympanic, soft, nondistended, nontender, and bowel sounds are quite active but normal in character, not obstructive.  Tragically, his son died unexpectedly last night.  Therefore, the patient is desirous of leaving the hospital to attend to family matters.  Recommendations:  1.  Okay for discharge at this time.  I do not think the patient needs a "test meal" before he goes home, in view of his clinical and laboratory improvement.  No special diet is needed post discharge, although it would be reasonable to advance gradually.  No new medications are needed.  2.  I have asked the patient to call my office at anytime if symptoms worsen, or in any event about a week to go over the results of his GI pathogen panel, and to arrange office follow-up.  He will need screening colonoscopy at some point in the near future since he is slightly overdue for that exam.    I have given him my card to facilitate communication.  Case discussed with Dr. Alfredia Ferguson.  Joel Orozco, M.D. Pager [phone] If no answer or after 5 PM call [phone]

## 2020-05-09 NOTE — Plan of Care (Signed)
Instructions were reviewed with patient. All questions were answered. Patient was transported to main entrance by wheelchair. ° °

## 2020-05-09 NOTE — Discharge Summary (Signed)
Physician Discharge Summary  Joel Orozco SFK:[id number] DOB: [birth year] DOA: 05/08/2020  PCP: [facility] @ [place]  Admit date: 05/08/2020 Discharge date: 05/09/2020  Admitted From: Home Disposition: Home  Recommendations for Outpatient Follow-up:  1. Follow up with PCP in 1-2 weeks  2. Follow up with Gastroenterology within 1-2 weeks and have Colonoscopy repeated 3. Please obtain BMP/CBC in one week 4. Please follow up on the following pending results:  Home Health: No Equipment/Devices: None    Discharge Condition: Stable  CODE STATUS: FULL CODE  Diet recommendation: Soft Diet   Brief/Interim Summary: HPI per Dr. Marva Panda on 05/08/20 This is a 70 year old male with a past medical history of appendectomy (in the 90s) who presented to the ED with lower abdominal pain, nausea and diarrhea since yesterday.  States he was in his usual state of health until yesterday morning when he started with lower abdominal pain described as similar to when he needed his appendectomy.  Discomfort continued throughout the day and had decreased p.o. intake with some nausea and retching but no vomiting initially.  Was able to have a loose but dark BM in the afternoon and symptoms subsided throughout the afternoon but returned yesterday evening after going to dinner with friends.  He only ate a small amount of food.  Throughout the night the patient had significant abdominal pain nausea, retching with some vomiting and diarrhea.  Denies personal or family history of Crohn's or ulcerative colitis.  Denies tobacco or alcohol use.  Typically very active and only takes aspirin for cardiovascular prophylaxis per his PCP and not for treatment.  Wife states he has been under a great amount of stress for the past several months after having persistent vertigo since December with an unremarkable ENT work-up outpatient and Covid at the beginning of February.   ED Course: Afebrile, hemodynamically  stable, on room air. Notable Labs: Lipase 26, AST 26, ALT 28, T bili 1.5, WBC 13.0, Hb 17.6, platelets 213. Notable Imaging: CT abdomen pelvis with contrast-multiple thickened appearing segments of mid to distal bowel, appearance highly suspicious for Crohn's disease; multiple fluid-filled, mildly distended loops of distal small bowel measuring up to 3.7 cm in caliber suggesting partial SBO,: Fluid-filled to sigmoid consistent with diarrhea, small volume ascites throughout abdomen pelvis particularly in RLQ and prostatomegaly with thickening of urinary bladder likely due to chronic outlet obstruction; aortic atherosclerosis. Patient received morphine, Zofran, 1 L NS bolus.  ED provider discussed with Dr. Cristina Gong, [facility], who will see in consult.  **Interim History Patient's diet was advanced and her clinical symptoms improved significantly.  He felt well and unexpectedly his son died last night so he wished to be discharged early this morning.  Case was discussed with GI who is cleared the patient for discharge given his clinical improvement.  Need to follow-up with PCP and he will follow up with GI within 1 to 2 weeks to have a repeat colonoscopy.  Patient is stable for discharge at this time as he is much improved and his numbers improved as well.  GI pathogen panel was not done so this can be done in outpatient setting.  Discharge Diagnoses:  Principal Problem:   SBO (small bowel obstruction) (HCC) Active Problems:   Aortic atherosclerosis (HCC)  SBO, likely partial -CT findings concerning for Crohn's disease and partial small bowel obstruction -Currently not in acute distress and now symptoms have resolved -No personal or family history of Crohn's.  Has had colonoscopies but no reports on  file and will follow up with GI as an outpatient for repeat colonoscopy -Patient was initially n.p.o. but diet was advanced to clears he tolerated this well and symptoms resolved.  Clinically he is doing a lot  better and is not having any bowel movement but is passing flatus.  GI was consulted and they recommending okay for discharge and they feel that he did not need to "test milk" before he goes home given his clinical improvement; patient's son tragically and unexpectedly died last night so patient wants to go home and we will discharge him given that he is improved significantly -He is to follow-up with GI within 1 to 2 weeks and he is to get a repeat colonoscopy done -Patient was advised that if he has any more clinical worsening to come back to the hospital and because his GI pathogen panel is still pending this can be done in the outpatient setting with GI  Aortic atherosclerosis -Noted on CT -Lipid panel and showed a total cholesterol/HDL ratio 5.0, cholesterol level of 219, HDL of 4, LDL of 134, triglycerides of 107, VLDL 21 -Recommend patient being started on a statin but this can be deferred to PCP evaluation as an outpatient Continue aspirin 81 mg p.o. daily  Hyperlipidemia -As above lipid panel was done -He does have elevated cholesterol level I recommend dietary and lifestyle changes as well as initiation of statin after discussion with PCP  Hyperbilirubinemia -Mild and Likely reactive  -Patient's T Bili is 1.5 and will need to be repeated as an outpatient -Repeat CMP within 1 week  Leukocytosis, likely reactive to Above -Resolved. WBC went from 13.0 -> 6.4 -GI Pathogen Panel not done but can be obtained as an outpatient -Continue to Monitor and Trend and repeat CBC within 1 week   Metabolic Acidosis  -Improved. Patient's CO2 is now 25, AG is 10, and Chloride Level is 106 -Continue to Monitor and Trend -Repeat CMP within 1 week  Thrombocytopenia -Mild. Patient's Platelet Count went from 213 -> 147 -No S/Sx of Bleeding -Continue to Monitor and Trend and repeat CBC within 1-2 weeks  Obesity -Complicates overall prognosis and care -Estimated body mass index is 32.25 kg/m as  calculated from the following:   Height as of this encounter: 5\' 8"  (1.727 m).   Weight as of this encounter: 96.2 kg. -Weight Loss and Dietary Counseling given   Discharge Instructions  Discharge Instructions    Call MD for:  difficulty breathing, headache or visual disturbances   Complete by: As directed    Call MD for:  extreme fatigue   Complete by: As directed    Call MD for:  hives   Complete by: As directed    Call MD for:  persistant dizziness or light-headedness   Complete by: As directed    Call MD for:  persistant nausea and vomiting   Complete by: As directed    Call MD for:  redness, tenderness, or signs of infection (pain, swelling, redness, odor or green/yellow discharge around incision site)   Complete by: As directed    Call MD for:  severe uncontrolled pain   Complete by: As directed    Call MD for:  temperature >100.4   Complete by: As directed    Diet - low sodium heart healthy   Complete by: As directed    Discharge instructions   Complete by: As directed    You were cared for by a hospitalist during your hospital stay. If you have any  questions about your discharge medications or the care you received while you were in the hospital after you are discharged, you can call the unit and ask to speak with the hospitalist on call if the hospitalist that took care of you is not available. Once you are discharged, your primary care physician will handle any further medical issues. Please note that NO REFILLS for any discharge medications will be authorized once you are discharged, as it is imperative that you return to your primary care physician (or establish a relationship with a primary care physician if you do not have one) for your aftercare needs so that they can reassess your need for medications and monitor your lab values.  Follow up with PCP and Gastroenterology within 1-2 weeks. Take all medications as prescribed. If symptoms change or worsen please return to  the ED for evaluation   Increase activity slowly   Complete by: As directed      Allergies as of 05/09/2020   No Known Allergies     Medication List    TAKE these medications   aspirin EC 81 MG tablet Take 81 mg by mouth daily.   cetirizine 10 MG tablet Commonly known as: ZYRTEC Take 10 mg by mouth daily.   ibuprofen 200 MG tablet Commonly known as: ADVIL Take 400 mg by mouth every 4 (four) hours as needed for headache or mild pain.   multivitamin with minerals tablet Take 1 tablet by mouth daily.   ondansetron 4 MG tablet Commonly known as: ZOFRAN Take 1 tablet (4 mg total) by mouth every 6 (six) hours as needed for nausea.   Turmeric 500 MG Caps Take 1 capsule by mouth daily.       No Known Allergies  Consultations:  Gastroenterology  Procedures/Studies: CT ABDOMEN PELVIS W CONTRAST  Result Date: 05/08/2020 CLINICAL DATA:  Lower abdominal pain, known gallbladder issues, EXAM: CT ABDOMEN AND PELVIS WITH CONTRAST TECHNIQUE: Multidetector CT imaging of the abdomen and pelvis was performed using the standard protocol following bolus administration of intravenous contrast. CONTRAST:  165mL OMNIPAQUE IOHEXOL 300 MG/ML  SOLN COMPARISON:  None. FINDINGS: Lower chest: No acute abnormality. Hepatobiliary: No solid liver abnormality is seen. No gallstones, gallbladder wall thickening, or biliary dilatation. Pancreas: Unremarkable. No pancreatic ductal dilatation or surrounding inflammatory changes. Spleen: Normal in size without significant abnormality. Adrenals/Urinary Tract: Adrenal glands are unremarkable. Kidneys are normal, without renal calculi, solid lesion, or hydronephrosis. Thickening of the urinary bladder wall, likely due to chronic outlet obstruction. Stomach/Bowel: Stomach is within normal limits. Appendix is surgically absent. There are multiple thickened, strictured appearing segments of mid to distal bowel, in the ventral mid abdomen measuring approximately 10 cm in  length (series 3, image 38), in the right lower quadrant measuring approximately 5 cm in length (series 3, image 45) and involving the terminal ileum measuring at least 10 cm in length (series 5, image 66). There are multiple fluid-filled, mildly distended loops of distal small bowel, measuring up to 3.7 cm in caliber. The colon is fluid-filled to the sigmoid and generally decompressed distally. Vascular/Lymphatic: Aortic atherosclerosis. No enlarged abdominal or pelvic lymph nodes. Reproductive: Prostatomegaly. Other: No abdominal wall hernia or abnormality. Small volume ascites throughout the abdomen and pelvis, particularly in the right lower quadrant. Musculoskeletal: No acute or significant osseous findings. IMPRESSION: 1. There are multiple thickened, strictured appearing segments of mid to distal bowel as detailed above. This appearance is highly suspicious for Crohn's disease. 2. There are multiple fluid-filled, mildly distended loops  of distal small bowel proximal to these strictured segments, measuring up to 3.7 cm in caliber and suggesting partial small bowel obstruction. 3. The colon is fluid-filled to the sigmoid, in keeping with diarrheal illness. 4. Small volume ascites throughout the abdomen and pelvis, particularly in the right lower quadrant. 5. Prostatomegaly with thickening of the urinary bladder wall, likely due to chronic outlet obstruction. Aortic Atherosclerosis (ICD10-I70.0). Electronically Signed   By: Eddie Candle M.D.   On: 05/08/2020 08:15     Subjective: Seen and examined at bedside and patient was doing much better.  He wished to go home ASAP given that his son unexpectedly died last night.  Case was discussed with GI and they cleared the patient.  Patient has no symptoms currently and feels much improved.  No other concerns or complaints at this time and all questions were answered to his satisfaction and his wife satisfaction.  Discharge Exam: Vitals:   05/09/20 0217 05/09/20  0539  BP: (!) 101/56 103/63  Pulse: 71 65  Resp: 18 17  Temp: 98.3 F (36.8 C) 97.8 F (36.6 C)  SpO2: 95% 98%   Vitals:   05/08/20 1752 05/08/20 2134 05/09/20 0217 05/09/20 0539  BP: (!) 126/52 120/69 (!) 101/56 103/63  Pulse: 90 73 71 65  Resp: 16 18 18 17   Temp: 97.6 F (36.4 C) 98.2 F (36.8 C) 98.3 F (36.8 C) 97.8 F (36.6 C)  TempSrc: Oral Oral Oral   SpO2: 99% 99% 95% 98%  Weight:    96.2 kg  Height:    5\' 8"  (1.727 m)   General: Pt is alert, awake, not in acute distress Cardiovascular: RRR, S1/S2 +, no rubs, no gallops Respiratory: Diminished bilaterally, no wheezing, no rhonchi Abdominal: Soft, NT, distended secondary body habitus, bowel sounds + Extremities: Trace edema, no cyanosis  The results of significant diagnostics from this hospitalization (including imaging, microbiology, ancillary and laboratory) are listed below for reference.    Microbiology: Recent Results (from the past 240 hour(s))  Resp Panel by RT-PCR (Flu A&B, Covid) Nasopharyngeal Swab     Status: None   Collection Time: 05/08/20  1:22 PM   Specimen: Nasopharyngeal Swab; Nasopharyngeal(NP) swabs in vial transport medium  Result Value Ref Range Status   SARS Coronavirus 2 by RT PCR NEGATIVE NEGATIVE Final    Comment: (NOTE) SARS-CoV-2 target nucleic acids are NOT DETECTED.  The SARS-CoV-2 RNA is generally detectable in upper respiratory specimens during the acute phase of infection. The lowest concentration of SARS-CoV-2 viral copies this assay can detect is 138 copies/mL. A negative result does not preclude SARS-Cov-2 infection and should not be used as the sole basis for treatment or other patient management decisions. A negative result may occur with  improper specimen collection/handling, submission of specimen other than nasopharyngeal swab, presence of viral mutation(s) within the areas targeted by this assay, and inadequate number of viral copies(<138 copies/mL). A negative result  must be combined with clinical observations, patient history, and epidemiological information. The expected result is Negative.  Fact Sheet for Patients:  EntrepreneurPulse.com.au  Fact Sheet for Healthcare Providers:  IncredibleEmployment.be  This test is no t yet approved or cleared by the [place] FDA and  has been authorized for detection and/or diagnosis of SARS-CoV-2 by FDA under an Emergency Use Authorization (EUA). This EUA will remain  in effect (meaning this test can be used) for the duration of the COVID-19 declaration under Section 564(b)(1) of the Act, 21 U.S.C.section 360bbb-3(b)(1), unless the authorization is  terminated  or revoked sooner.       Influenza A by PCR NEGATIVE NEGATIVE Final   Influenza B by PCR NEGATIVE NEGATIVE Final    Comment: (NOTE) The Xpert Xpress SARS-CoV-2/FLU/RSV plus assay is intended as an aid in the diagnosis of influenza from Nasopharyngeal swab specimens and should not be used as a sole basis for treatment. Nasal washings and aspirates are unacceptable for Xpert Xpress SARS-CoV-2/FLU/RSV testing.  Fact Sheet for Patients: EntrepreneurPulse.com.au  Fact Sheet for Healthcare Providers: IncredibleEmployment.be  This test is not yet approved or cleared by the [place] FDA and has been authorized for detection and/or diagnosis of SARS-CoV-2 by FDA under an Emergency Use Authorization (EUA). This EUA will remain in effect (meaning this test can be used) for the duration of the COVID-19 declaration under Section 564(b)(1) of the Act, 21 U.S.C. section 360bbb-3(b)(1), unless the authorization is terminated or revoked.  Performed at [facility], [place] [address], [place], [place] [address]     Labs: BNP (last 3 results) No results for input(s): BNP in the last 8760 hours. Basic Metabolic Panel: Recent Labs  Lab 05/08/20 0619  05/09/20 0453  NA 135 141  K 4.0 4.4  CL 100 106  CO2 20* 25  GLUCOSE 152* 96  BUN 20 17  CREATININE 1.10 1.22  CALCIUM 10.1 8.6*   Liver Function Tests: Recent Labs  Lab 05/08/20 0619  AST 26  ALT 28  ALKPHOS 92  BILITOT 1.5*  PROT 7.9  ALBUMIN 4.8   Recent Labs  Lab 05/08/20 0619  LIPASE 26   No results for input(s): AMMONIA in the last 168 hours. CBC: Recent Labs  Lab 05/08/20 0619 05/09/20 0453  WBC 13.0* 6.4  NEUTROABS 10.2*  --   HGB 17.6* 13.4  HCT 50.9 40.9  MCV 88.7 93.0  PLT 213 147*   Cardiac Enzymes: No results for input(s): CKTOTAL, CKMB, CKMBINDEX, TROPONINI in the last 168 hours. BNP: Invalid input(s): POCBNP CBG: No results for input(s): GLUCAP in the last 168 hours. D-Dimer No results for input(s): DDIMER in the last 72 hours. Hgb A1c No results for input(s): HGBA1C in the last 72 hours. Lipid Profile Recent Labs    05/08/20 0619  CHOL 219*  HDL 44  LDLCALC 154*  TRIG 107  CHOLHDL 5.0   Thyroid function studies No results for input(s): TSH, T4TOTAL, T3FREE, THYROIDAB in the last 72 hours.  Invalid input(s): FREET3 Anemia work up No results for input(s): VITAMINB12, FOLATE, FERRITIN, TIBC, IRON, RETICCTPCT in the last 72 hours. Urinalysis    Component Value Date/Time   COLORURINE YELLOW 05/08/2020 0619   APPEARANCEUR CLEAR 05/08/2020 0619   LABSPEC 1.010 05/08/2020 0619   PHURINE 5.0 05/08/2020 0619   GLUCOSEU NEGATIVE 05/08/2020 0619   HGBUR NEGATIVE 05/08/2020 0619   BILIRUBINUR NEGATIVE 05/08/2020 0619   KETONESUR 5 (A) 05/08/2020 0619   PROTEINUR NEGATIVE 05/08/2020 0619   UROBILINOGEN 0.2 09/28/2007 0421   NITRITE NEGATIVE 05/08/2020 0619   LEUKOCYTESUR NEGATIVE 05/08/2020 4315   Sepsis Labs Invalid input(s): PROCALCITONIN,  WBC,  LACTICIDVEN Microbiology Recent Results (from the past 240 hour(s))  Resp Panel by RT-PCR (Flu A&B, Covid) Nasopharyngeal Swab     Status: None   Collection Time: 05/08/20  1:22 PM    Specimen: Nasopharyngeal Swab; Nasopharyngeal(NP) swabs in vial transport medium  Result Value Ref Range Status   SARS Coronavirus 2 by RT PCR NEGATIVE NEGATIVE Final    Comment: (NOTE) SARS-CoV-2 target nucleic acids are  NOT DETECTED.  The SARS-CoV-2 RNA is generally detectable in upper respiratory specimens during the acute phase of infection. The lowest concentration of SARS-CoV-2 viral copies this assay can detect is 138 copies/mL. A negative result does not preclude SARS-Cov-2 infection and should not be used as the sole basis for treatment or other patient management decisions. A negative result may occur with  improper specimen collection/handling, submission of specimen other than nasopharyngeal swab, presence of viral mutation(s) within the areas targeted by this assay, and inadequate number of viral copies(<138 copies/mL). A negative result must be combined with clinical observations, patient history, and epidemiological information. The expected result is Negative.  Fact Sheet for Patients:  EntrepreneurPulse.com.au  Fact Sheet for Healthcare Providers:  IncredibleEmployment.be  This test is no t yet approved or cleared by the [place] FDA and  has been authorized for detection and/or diagnosis of SARS-CoV-2 by FDA under an Emergency Use Authorization (EUA). This EUA will remain  in effect (meaning this test can be used) for the duration of the COVID-19 declaration under Section 564(b)(1) of the Act, 21 U.S.C.section 360bbb-3(b)(1), unless the authorization is terminated  or revoked sooner.       Influenza A by PCR NEGATIVE NEGATIVE Final   Influenza B by PCR NEGATIVE NEGATIVE Final    Comment: (NOTE) The Xpert Xpress SARS-CoV-2/FLU/RSV plus assay is intended as an aid in the diagnosis of influenza from Nasopharyngeal swab specimens and should not be used as a sole basis for treatment. Nasal washings and aspirates are  unacceptable for Xpert Xpress SARS-CoV-2/FLU/RSV testing.  Fact Sheet for Patients: EntrepreneurPulse.com.au  Fact Sheet for Healthcare Providers: IncredibleEmployment.be  This test is not yet approved or cleared by the [place] FDA and has been authorized for detection and/or diagnosis of SARS-CoV-2 by FDA under an Emergency Use Authorization (EUA). This EUA will remain in effect (meaning this test can be used) for the duration of the COVID-19 declaration under Section 564(b)(1) of the Act, 21 U.S.C. section 360bbb-3(b)(1), unless the authorization is terminated or revoked.  Performed at [facility], [place] [address] [place], [place] [address]    Time coordinating discharge: 35 minutes  SIGNED:  Kerney Elbe, DO [facility] 05/09/2020, 9:09 AM Pager is on [place]  If 7PM-7AM, please contact night-coverage [email or web address]

## 2020-05-28 DIAGNOSIS — R935 Abnormal findings on diagnostic imaging of other abdominal regions, including retroperitoneum: Secondary | ICD-10-CM | POA: Diagnosis not present

## 2020-05-28 DIAGNOSIS — Z1211 Encounter for screening for malignant neoplasm of colon: Secondary | ICD-10-CM | POA: Diagnosis not present

## 2020-07-03 DIAGNOSIS — D128 Benign neoplasm of rectum: Secondary | ICD-10-CM | POA: Diagnosis not present

## 2020-07-03 DIAGNOSIS — D120 Benign neoplasm of cecum: Secondary | ICD-10-CM | POA: Diagnosis not present

## 2020-07-03 DIAGNOSIS — Z1211 Encounter for screening for malignant neoplasm of colon: Secondary | ICD-10-CM | POA: Diagnosis not present

## 2020-07-03 DIAGNOSIS — D122 Benign neoplasm of ascending colon: Secondary | ICD-10-CM | POA: Diagnosis not present

## 2020-07-05 DIAGNOSIS — D122 Benign neoplasm of ascending colon: Secondary | ICD-10-CM | POA: Diagnosis not present

## 2020-07-05 DIAGNOSIS — D120 Benign neoplasm of cecum: Secondary | ICD-10-CM | POA: Diagnosis not present

## 2020-07-05 DIAGNOSIS — D128 Benign neoplasm of rectum: Secondary | ICD-10-CM | POA: Diagnosis not present

## 2021-02-10 DIAGNOSIS — Z7184 Encounter for health counseling related to travel: Secondary | ICD-10-CM | POA: Diagnosis not present

## 2021-12-09 DIAGNOSIS — J069 Acute upper respiratory infection, unspecified: Secondary | ICD-10-CM | POA: Diagnosis not present

## 2021-12-12 DIAGNOSIS — J22 Unspecified acute lower respiratory infection: Secondary | ICD-10-CM | POA: Diagnosis not present

## 2021-12-12 DIAGNOSIS — K5903 Drug induced constipation: Secondary | ICD-10-CM | POA: Diagnosis not present

## 2021-12-12 DIAGNOSIS — R300 Dysuria: Secondary | ICD-10-CM | POA: Diagnosis not present

## 2021-12-13 ENCOUNTER — Encounter (HOSPITAL_BASED_OUTPATIENT_CLINIC_OR_DEPARTMENT_OTHER): Payer: Self-pay

## 2021-12-13 ENCOUNTER — Other Ambulatory Visit: Payer: Self-pay

## 2021-12-13 ENCOUNTER — Emergency Department (HOSPITAL_BASED_OUTPATIENT_CLINIC_OR_DEPARTMENT_OTHER)
Admission: EM | Admit: 2021-12-13 | Discharge: 2021-12-13 | Disposition: A | Discharge status: Home / Self Care | Payer: Medicare HMO | Attending: Emergency Medicine | Admitting: Emergency Medicine

## 2021-12-13 DIAGNOSIS — Z7982 Long term (current) use of aspirin: Secondary | ICD-10-CM | POA: Insufficient documentation

## 2021-12-13 DIAGNOSIS — R300 Dysuria: Secondary | ICD-10-CM | POA: Diagnosis not present

## 2021-12-13 DIAGNOSIS — Z79899 Other long term (current) drug therapy: Secondary | ICD-10-CM | POA: Diagnosis not present

## 2021-12-13 DIAGNOSIS — R339 Retention of urine, unspecified: Secondary | ICD-10-CM | POA: Diagnosis not present

## 2021-12-13 LAB — CBC WITH DIFFERENTIAL/PLATELET
Abs Immature Granulocytes: 0.02 10*3/uL (ref 0.00–0.07)
Basophils Absolute: 0.1 10*3/uL (ref 0.0–0.1)
Basophils Relative: 1 %
Eosinophils Absolute: 0.2 10*3/uL (ref 0.0–0.5)
Eosinophils Relative: 3 %
HCT: 43.6 % (ref 39.0–52.0)
Hemoglobin: 15.2 g/dL (ref 13.0–17.0)
Immature Granulocytes: 0 %
Lymphocytes Relative: 17 %
Lymphs Abs: 1.4 10*3/uL (ref 0.7–4.0)
MCH: 30.8 pg (ref 26.0–34.0)
MCHC: 34.9 g/dL (ref 30.0–36.0)
MCV: 88.4 fL (ref 80.0–100.0)
Monocytes Absolute: 0.9 10*3/uL (ref 0.1–1.0)
Monocytes Relative: 11 %
Neutro Abs: 5.2 10*3/uL (ref 1.7–7.7)
Neutrophils Relative %: 68 %
Platelets: 179 10*3/uL (ref 150–400)
RBC: 4.93 MIL/uL (ref 4.22–5.81)
RDW: 12.8 % (ref 11.5–15.5)
WBC: 7.8 10*3/uL (ref 4.0–10.5)
nRBC: 0 % (ref 0.0–0.2)

## 2021-12-13 LAB — URINALYSIS, ROUTINE W REFLEX MICROSCOPIC
Bilirubin Urine: NEGATIVE
Glucose, UA: NEGATIVE mg/dL
Ketones, ur: NEGATIVE mg/dL
Leukocytes,Ua: NEGATIVE
Nitrite: NEGATIVE
Protein, ur: NEGATIVE mg/dL
Specific Gravity, Urine: 1.014 (ref 1.005–1.030)
pH: 5.5 (ref 5.0–8.0)

## 2021-12-13 LAB — BASIC METABOLIC PANEL
Anion gap: 10 (ref 5–15)
BUN: 16 mg/dL (ref 8–23)
CO2: 25 mmol/L (ref 22–32)
Calcium: 9.3 mg/dL (ref 8.9–10.3)
Chloride: 101 mmol/L (ref 98–111)
Creatinine, Ser: 1.12 mg/dL (ref 0.61–1.24)
GFR, Estimated: >60 mL/min (ref >60)
Glucose, Bld: 114 mg/dL — ABNORMAL HIGH (ref 70–99)
Potassium: 4 mmol/L (ref 3.5–5.1)
Sodium: 136 mmol/L (ref 135–145)

## 2021-12-13 MED ORDER — TAMSULOSIN HCL 0.4 MG PO CAPS: 0.4000 mg | ORAL_CAPSULE | Freq: Every day | ORAL | 0 refills | Status: AC

## 2021-12-13 NOTE — ED Triage Notes (Signed)
States  went to walk in clinic yesterday and was prescribed doxycycline tab 100 mg twice daily and states he cannot urinate. Endorses hx of problems urinating. Denies fever fever,vomiting, States has chills and dizziness.

## 2021-12-13 NOTE — ED Triage Notes (Signed)
Patient would like to add the he currently has bronchitis & upper respiratory infection.   Along with foul smelling urine.

## 2021-12-13 NOTE — ED Provider Notes (Signed)
[facility] EMERGENCY DEPT Provider Note  CSN: [id number] Arrival date & time: 12/13/21 6606  Chief Complaint(s) Dysuria  HPI Joel Orozco is a 71 y.o. male without significant past medical history presenting to the emergency department with inability to pee.  Patient reports that he has been having some foul-smelling urine recently, saw his primary doctor who also diagnosed him with bronchitis and started him on doxycycline.  He reports he has not been able to urinate since last night.  Denies similar episodes in the past.  No rectal pain.  No fevers or chills.  No flank pain.  Symptoms are moderate.   Past Medical History History reviewed. No pertinent past medical history. Patient Active Problem List   Diagnosis Date Noted   SBO (small bowel obstruction) ([place]) 05/08/2020   Aortic atherosclerosis ([place]) 05/08/2020   Home Medication(s) Prior to Admission medications   Medication Sig Start Date End Date Taking? Authorizing Provider  tamsulosin (FLOMAX) 0.4 MG CAPS capsule Take 1 capsule (0.4 mg total) by mouth daily. 12/13/21  Yes Cristie Hem, MD  aspirin EC 81 MG tablet Take 81 mg by mouth daily.    [provider]  cetirizine (ZYRTEC) 10 MG tablet Take 10 mg by mouth daily.    [provider]  ibuprofen (ADVIL) 200 MG tablet Take 400 mg by mouth every 4 (four) hours as needed for headache or mild pain.    [provider]  Multiple Vitamins-Minerals (MULTIVITAMIN WITH MINERALS) tablet Take 1 tablet by mouth daily.    [provider]  ondansetron (ZOFRAN) 4 MG tablet Take 1 tablet (4 mg total) by mouth every 6 (six) hours as needed for nausea. 05/09/20   Sheikh, Omair Latif, DO  Turmeric 500 MG CAPS Take 1 capsule by mouth daily.    [provider]                                                                                                                                    Past Surgical History Past Surgical  History:  Procedure Laterality Date   APPENDECTOMY     Family History History reviewed. No pertinent family history.  Social History Social History   Tobacco Use   Smoking status: Never   Smokeless tobacco: Never  Substance Use Topics   Alcohol use: No   Drug use: No   Allergies Patient has no known allergies.  Review of Systems Review of Systems  All other systems reviewed and are negative.   Physical Exam Vital Signs  I have reviewed the triage vital signs BP (!) 168/94 (BP Location: Right Arm)   Pulse 69   Temp 98.1 F (36.7 C) (Oral)   Resp 18   Ht '5\' 8"'$  (1.727 m)   Wt 96.6 kg   SpO2 98%   BMI 32.39 kg/m  Physical Exam Vitals and nursing note reviewed.  Constitutional:      General: He  is not in acute distress.    Appearance: Normal appearance.  HENT:     Mouth/Throat:     Mouth: Mucous membranes are moist.  Eyes:     Conjunctiva/sclera: Conjunctivae normal.  Cardiovascular:     Rate and Rhythm: Normal rate and regular rhythm.  Pulmonary:     Effort: Pulmonary effort is normal. No respiratory distress.     Breath sounds: Normal breath sounds.  Abdominal:     General: Abdomen is flat.     Palpations: Abdomen is soft.     Tenderness: There is no abdominal tenderness. There is no right CVA tenderness or left CVA tenderness.  Musculoskeletal:     Right lower leg: No edema.     Left lower leg: No edema.  Skin:    General: Skin is warm and dry.     Capillary Refill: Capillary refill takes less than 2 seconds.  Neurological:     Mental Status: He is alert and oriented to person, place, and time. Mental status is at baseline.  Psychiatric:        Mood and Affect: Mood normal.        Behavior: Behavior normal.     ED Results and Treatments Labs (all labs ordered are listed, but only abnormal results are displayed) Labs Reviewed  BASIC METABOLIC PANEL - Abnormal; Notable for the following components:      Result Value   Glucose, Bld 114 (*)     All other components within normal limits  URINALYSIS, ROUTINE W REFLEX MICROSCOPIC - Abnormal; Notable for the following components:   Hgb urine dipstick SMALL (*)    All other components within normal limits  URINE CULTURE  CBC WITH DIFFERENTIAL/PLATELET                                                                                                                          Radiology No results found.  Pertinent labs & imaging results that were available during my care of the patient were reviewed by me and considered in my medical decision making (see MDM for details).  Medications Ordered in ED Medications - No data to display                                                                                                                                   Procedures Procedures  (including critical care time)  Medical Decision  Making / ED Course   MDM:  71 year old male presenting to the emergency department with inability to urinate.  Initial evaluation patient had PVR of over 700, Foley catheter was placed with resolution of symptoms of pain.  Urinalysis not overly suspicious for urinary infection without bacteria or leukocytes.  Will perform prostate exam to evaluate for prostatitis as cause.  Urine may also be sterilized from doxycycline.  Urine culture sent.  Will obtain basic labs to rule out AKI.  Will reassess.  Clinical Course as of 12/13/21 1525  Sat Dec 13, 2021  1511 Prostate exam performed with no evidence of prostatitis. Chaperoned by RN.  As interpreted by me, notable for reassuring creatinine and electrolytes.  Urinalysis notable for only small hemoglobin on dipstick. Will discharge patient to home and referred to urology. All questions answered. Patient comfortable with plan of discharge. Return precautions discussed with patient and specified on the after visit summary.  [WS]    Clinical Course User Index [WS] Cristie Hem, MD     Additional history  obtained: -Additional history obtained from family -External records from outside source obtained and reviewed including: Chart review including previous notes, labs, imaging, consultation notes including admit for bowel obstruction 3/9   Lab Tests: -I ordered, reviewed, and interpreted labs.   The pertinent results include:   Labs Reviewed  BASIC METABOLIC PANEL - Abnormal; Notable for the following components:      Result Value   Glucose, Bld 114 (*)    All other components within normal limits  URINALYSIS, ROUTINE W REFLEX MICROSCOPIC - Abnormal; Notable for the following components:   Hgb urine dipstick SMALL (*)    All other components within normal limits  URINE CULTURE  CBC WITH DIFFERENTIAL/PLATELET    Notable for small hgb in urine Medicines ordered and prescription drug management: Meds ordered this encounter  Medications   tamsulosin (FLOMAX) 0.4 MG CAPS capsule    Sig: Take 1 capsule (0.4 mg total) by mouth daily.    Dispense:  30 capsule    Refill:  0    -I have reviewed the patients home medicines and have made adjustments as needed  Reevaluation: After the interventions noted above, I reevaluated the patient and found that they have resolved  Co morbidities that complicate the patient evaluation History reviewed. No pertinent past medical history.    Dispostion: Disposition decision including need for hospitalization was considered, and patient discharged from emergency department.    Final Clinical Impression(s) / ED Diagnoses Final diagnoses:  Urinary retention     This chart was dictated using voice recognition software.  Despite best efforts to proofread,  errors can occur which can change the documentation meaning.    Cristie Hem, MD 12/13/21 1525

## 2021-12-13 NOTE — Discharge Instructions (Addendum)
We evaluated you for your urinary retention.  We placed a Foley catheter.  Please call urology for follow-up.  Please continue your course of antibiotics prescribed by your primary doctor.

## 2021-12-15 LAB — URINE CULTURE: Culture: NO GROWTH

## 2021-12-18 DIAGNOSIS — J209 Acute bronchitis, unspecified: Secondary | ICD-10-CM | POA: Diagnosis not present

## 2021-12-18 DIAGNOSIS — Z6831 Body mass index (BMI) 31.0-31.9, adult: Secondary | ICD-10-CM | POA: Diagnosis not present

## 2021-12-19 ENCOUNTER — Telehealth: Payer: Self-pay

## 2021-12-19 NOTE — Telephone Encounter (Signed)
        Patient  visited [facility] on 10/14   Telephone encounter attempt :  1st  Unable to leave a message   [facility], [place] [facility]  [phone] [address], [place], [place] [address] Phone: [phone] Email: [email or web address]

## 2021-12-31 DIAGNOSIS — R338 Other retention of urine: Secondary | ICD-10-CM | POA: Diagnosis not present

## 2022-01-07 DIAGNOSIS — R339 Retention of urine, unspecified: Secondary | ICD-10-CM | POA: Diagnosis not present

## 2022-01-07 DIAGNOSIS — R338 Other retention of urine: Secondary | ICD-10-CM | POA: Diagnosis not present

## 2022-01-07 DIAGNOSIS — Z7982 Long term (current) use of aspirin: Secondary | ICD-10-CM | POA: Diagnosis not present

## 2022-01-08 ENCOUNTER — Emergency Department (HOSPITAL_BASED_OUTPATIENT_CLINIC_OR_DEPARTMENT_OTHER)
Admission: EM | Admit: 2022-01-08 | Discharge: 2022-01-08 | Disposition: A | Discharge status: Home / Self Care | Payer: Medicare HMO | Attending: Emergency Medicine | Admitting: Emergency Medicine

## 2022-01-08 ENCOUNTER — Encounter (HOSPITAL_BASED_OUTPATIENT_CLINIC_OR_DEPARTMENT_OTHER): Payer: Self-pay

## 2022-01-08 DIAGNOSIS — R338 Other retention of urine: Secondary | ICD-10-CM

## 2022-01-08 NOTE — ED Triage Notes (Signed)
Pt presents to the ED with urinary retention. Last urinated at [facility]. Recently had a catheter for urinary retention due to prostatitis and UTI. Is currently being treated for these and just started treatment yesterday.

## 2022-01-08 NOTE — ED Provider Notes (Signed)
Joel Orozco   CSN: [id number] Arrival date & time: 01/07/22  2355     History  Chief Complaint  Patient presents with   Urinary Retention    Joel Orozco is a 71 y.o. male.  The history is provided by the patient.  He has history of prostatic hypertrophy and urinary retention and comes in with urinary retention tonight.  He states that at about 7:00 tonight he was unable to urinate except for very small amounts.  Of Orozco, he had seen his urologist yesterday and was diagnosed with prostatitis and received a prescription for ciprofloxacin.  He denies fever or chills.   Home Medications Prior to Admission medications   Medication Sig Start Date End Date Taking? Authorizing Provider  aspirin EC 81 MG tablet Take 81 mg by mouth daily.    [provider]  cetirizine (ZYRTEC) 10 MG tablet Take 10 mg by mouth daily.    [provider]  ibuprofen (ADVIL) 200 MG tablet Take 400 mg by mouth every 4 (four) hours as needed for headache or mild pain.    [provider]  Multiple Vitamins-Minerals (MULTIVITAMIN WITH MINERALS) tablet Take 1 tablet by mouth daily.    [provider]  ondansetron (ZOFRAN) 4 MG tablet Take 1 tablet (4 mg total) by mouth every 6 (six) hours as needed for nausea. 05/09/20   Raiford Noble Latif, DO  tamsulosin (FLOMAX) 0.4 MG CAPS capsule Take 1 capsule (0.4 mg total) by mouth daily. 12/13/21   Cristie Hem, MD  Turmeric 500 MG CAPS Take 1 capsule by mouth daily.    [provider]      Allergies    Patient has no known allergies.    Review of Systems   Review of Systems  All other systems reviewed and are negative.   Physical Exam Updated Vital Signs BP (!) 149/75 (BP Location: Left Arm)   Pulse 91   Temp 98.2 F (36.8 C) (Oral)   Resp 18   Ht '5\' 8"'$  (1.727 m)   Wt 90.7 kg   SpO2 100%   BMI 30.41 kg/m  Physical Exam Vitals and nursing Orozco reviewed.   71  year old male, resting comfortably and in no acute distress. Vital signs are significant for elevated blood pressure. Oxygen saturation is 100%, which is normal. Head is normocephalic and atraumatic. PERRLA, EOMI.  Neck is nontender and supple without adenopathy or JVD. Back is nontender and there is no CVA tenderness. Lungs are clear without rales, wheezes, or rhonchi. Chest is nontender. Heart has regular rate and rhythm without murmur. Abdomen is soft, flat, nontender.  I saw him after Foley catheter had been inserted, bladder is not distended at this time. Extremities have no cyanosis or edema, full range of motion is present. Skin is warm and dry without rash. Neurologic: Mental status is normal, cranial nerves are intact, moves all extremities equally.  ED Results / Procedures / Treatments    Procedures Procedures    Medications Ordered in ED Medications - No data to display  ED Course/ Medical Decision Making/ A&P                           Medical Decision Making  Acute urinary retention secondary to combination of benign prostatic hypertrophy and prostatitis.  Bladder scan showed over 500 mL in the bladder and I have ordered a Foley catheter be placed.  Following this, patient  feels much better.  I have instructed him to follow-up with his urologist and to contact them tomorrow to see if they would need to change his treatment regarding prostatitis.  I have reviewed his old records, and he actually had been seen in the emergency department on 12/13/2021 for acute urinary retention.  Final Clinical Impression(s) / ED Diagnoses Final diagnoses:  Acute urinary retention    Rx / DC Orders ED Discharge Orders     None         Delora Fuel, MD 80/22/17 0050

## 2022-01-08 NOTE — Discharge Instructions (Signed)
Continue taking your antibiotic as prescribed.  Please contact your urologist for follow-up.

## 2022-01-08 NOTE — ED Notes (Signed)
Pt verbalizes understanding of discharge instructions. Opportunity for questioning and answers were provided. Pt discharged from ED to home.   ? ?

## 2022-01-08 NOTE — ED Notes (Signed)
Bladder scan 562m's. MD notified

## 2022-01-09 DIAGNOSIS — N410 Acute prostatitis: Secondary | ICD-10-CM | POA: Diagnosis not present

## 2022-01-09 DIAGNOSIS — R338 Other retention of urine: Secondary | ICD-10-CM | POA: Diagnosis not present

## 2022-01-15 DIAGNOSIS — R338 Other retention of urine: Secondary | ICD-10-CM | POA: Diagnosis not present

## 2022-01-15 DIAGNOSIS — N410 Acute prostatitis: Secondary | ICD-10-CM | POA: Diagnosis not present

## 2022-02-05 DIAGNOSIS — Z125 Encounter for screening for malignant neoplasm of prostate: Secondary | ICD-10-CM | POA: Diagnosis not present

## 2022-02-05 DIAGNOSIS — R972 Elevated prostate specific antigen [PSA]: Secondary | ICD-10-CM | POA: Diagnosis not present

## 2022-02-05 DIAGNOSIS — R338 Other retention of urine: Secondary | ICD-10-CM | POA: Diagnosis not present

## 2022-02-12 DIAGNOSIS — Z5181 Encounter for therapeutic drug level monitoring: Secondary | ICD-10-CM | POA: Diagnosis not present

## 2022-02-12 DIAGNOSIS — Z6830 Body mass index (BMI) 30.0-30.9, adult: Secondary | ICD-10-CM | POA: Diagnosis not present

## 2022-02-12 DIAGNOSIS — E785 Hyperlipidemia, unspecified: Secondary | ICD-10-CM | POA: Diagnosis not present

## 2022-02-12 DIAGNOSIS — R972 Elevated prostate specific antigen [PSA]: Secondary | ICD-10-CM | POA: Diagnosis not present

## 2022-02-17 DIAGNOSIS — Z0000 Encounter for general adult medical examination without abnormal findings: Secondary | ICD-10-CM | POA: Diagnosis not present

## 2022-02-17 DIAGNOSIS — Z1389 Encounter for screening for other disorder: Secondary | ICD-10-CM | POA: Diagnosis not present

## 2022-02-17 DIAGNOSIS — Z6830 Body mass index (BMI) 30.0-30.9, adult: Secondary | ICD-10-CM | POA: Diagnosis not present

## 2022-02-18 DIAGNOSIS — K5900 Constipation, unspecified: Secondary | ICD-10-CM | POA: Diagnosis not present

## 2022-02-18 DIAGNOSIS — Z86010 Personal history of colonic polyps: Secondary | ICD-10-CM | POA: Diagnosis not present

## 2022-03-12 DIAGNOSIS — R972 Elevated prostate specific antigen [PSA]: Secondary | ICD-10-CM | POA: Diagnosis not present

## 2022-03-12 DIAGNOSIS — R338 Other retention of urine: Secondary | ICD-10-CM | POA: Diagnosis not present

## 2022-03-12 DIAGNOSIS — N401 Enlarged prostate with lower urinary tract symptoms: Secondary | ICD-10-CM | POA: Diagnosis not present

## 2022-03-19 DIAGNOSIS — R972 Elevated prostate specific antigen [PSA]: Secondary | ICD-10-CM | POA: Diagnosis not present

## 2022-04-01 DIAGNOSIS — H52223 Regular astigmatism, bilateral: Secondary | ICD-10-CM | POA: Diagnosis not present

## 2022-04-01 DIAGNOSIS — Z135 Encounter for screening for eye and ear disorders: Secondary | ICD-10-CM | POA: Diagnosis not present

## 2022-04-01 DIAGNOSIS — H5203 Hypermetropia, bilateral: Secondary | ICD-10-CM | POA: Diagnosis not present

## 2022-04-01 DIAGNOSIS — H524 Presbyopia: Secondary | ICD-10-CM | POA: Diagnosis not present

## 2022-04-27 DIAGNOSIS — U071 COVID-19: Secondary | ICD-10-CM | POA: Diagnosis not present

## 2022-08-19 DIAGNOSIS — Z0000 Encounter for general adult medical examination without abnormal findings: Secondary | ICD-10-CM | POA: Diagnosis not present

## 2022-08-19 DIAGNOSIS — R339 Retention of urine, unspecified: Secondary | ICD-10-CM | POA: Diagnosis not present

## 2022-08-19 DIAGNOSIS — E785 Hyperlipidemia, unspecified: Secondary | ICD-10-CM | POA: Diagnosis not present

## 2022-08-19 DIAGNOSIS — K5900 Constipation, unspecified: Secondary | ICD-10-CM | POA: Diagnosis not present

## 2022-08-19 DIAGNOSIS — J309 Allergic rhinitis, unspecified: Secondary | ICD-10-CM | POA: Diagnosis not present

## 2022-08-19 DIAGNOSIS — R030 Elevated blood-pressure reading, without diagnosis of hypertension: Secondary | ICD-10-CM | POA: Diagnosis not present

## 2022-08-19 DIAGNOSIS — Z6831 Body mass index (BMI) 31.0-31.9, adult: Secondary | ICD-10-CM | POA: Diagnosis not present

## 2022-08-25 DIAGNOSIS — H52223 Regular astigmatism, bilateral: Secondary | ICD-10-CM | POA: Diagnosis not present

## 2022-08-25 DIAGNOSIS — H524 Presbyopia: Secondary | ICD-10-CM | POA: Diagnosis not present

## 2022-12-11 DIAGNOSIS — E785 Hyperlipidemia, unspecified: Secondary | ICD-10-CM | POA: Diagnosis not present

## 2023-01-04 IMAGING — CT CT ABD-PELV W/ CM
2 of 5 series · 15 of 46 positions shown, 17 images · IV contrast (OMNIPAQUE 300)
Comparison: None.

CLINICAL DATA: Lower abdominal pain, known gallbladder issues,

EXAM:
CT ABDOMEN AND PELVIS WITH CONTRAST
TECHNIQUE: Multidetector CT imaging of the abdomen and pelvis was performed
using the standard protocol following bolus administration of
intravenous contrast.
CONTRAST:  100mL OMNIPAQUE IOHEXOL 300 MG/ML  SOLN

[Series 3: axial st · axial · 0.77mm/px · z∈[+1160,+1570]mm · 12 of 96 slices shown, 14 images]
[im 7/96  soft-tissue]
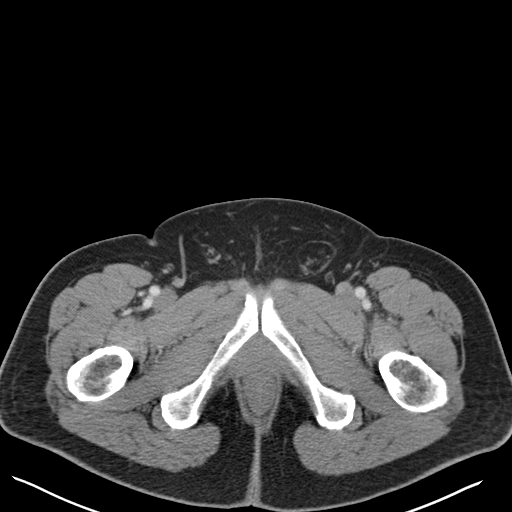
[im 7/96  bone]
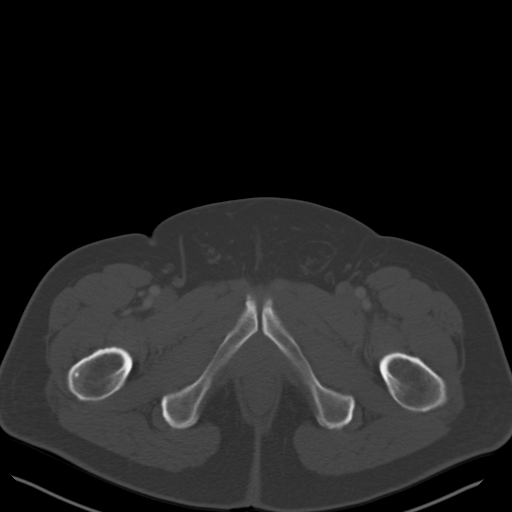
[im 13/96  soft-tissue]
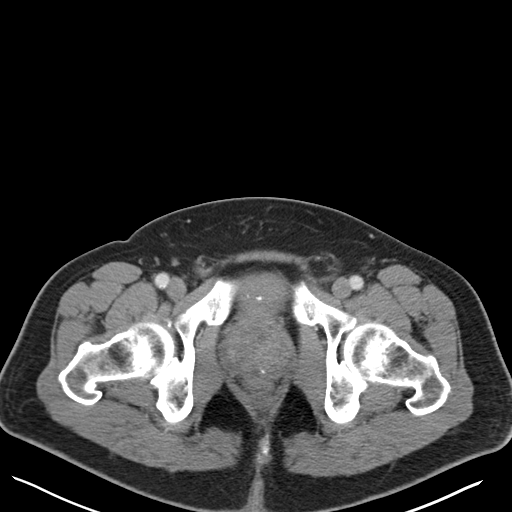
[im 20/96  soft-tissue]
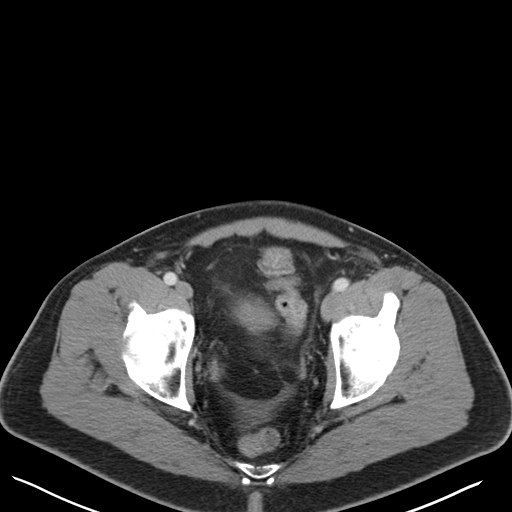
[im 32/96  soft-tissue]
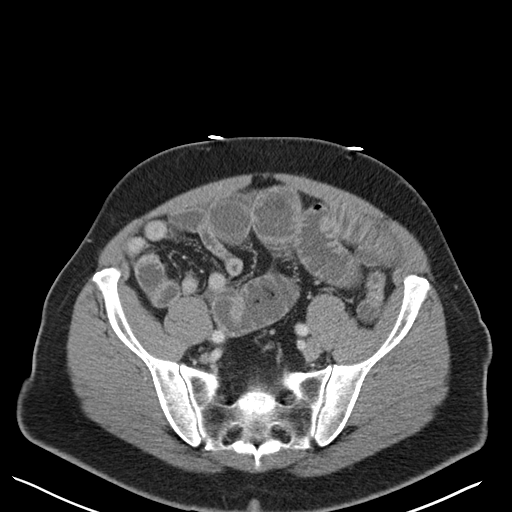
[im 39/96  soft-tissue]
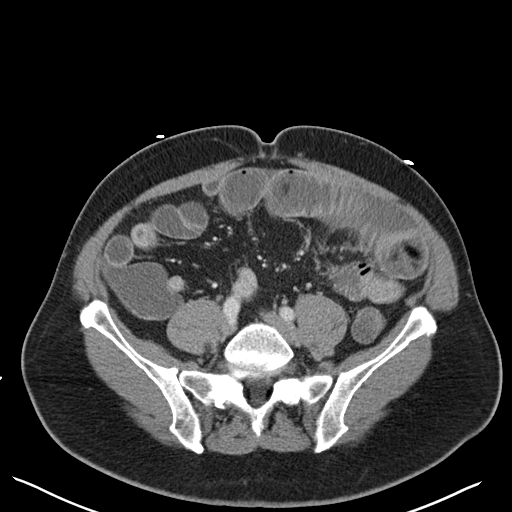
[im 45/96  soft-tissue]
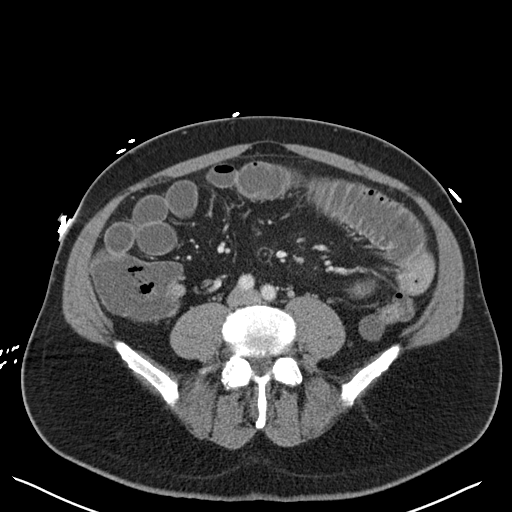
[im 51/96  soft-tissue]
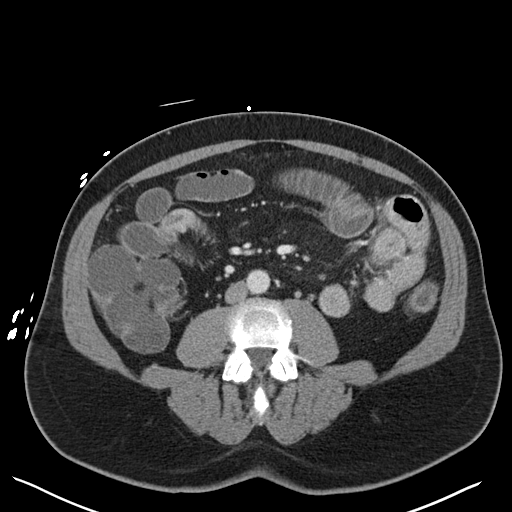
[im 58/96  soft-tissue]
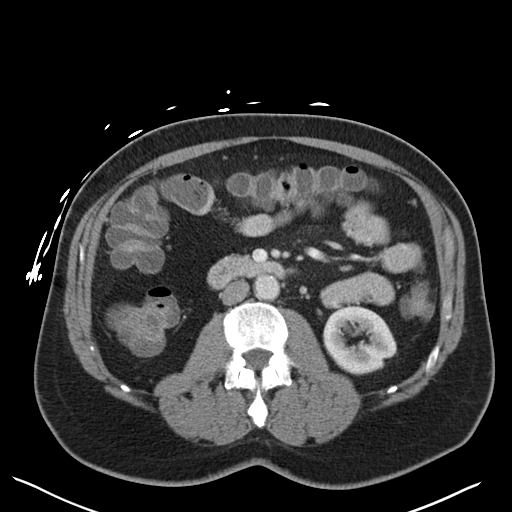
[im 64/96  soft-tissue]
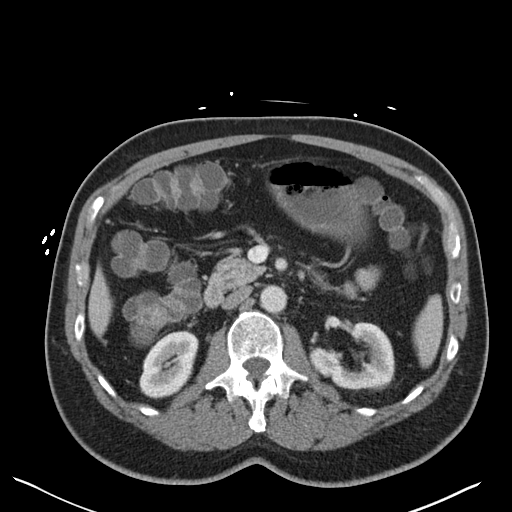
[im 64/96  bone]
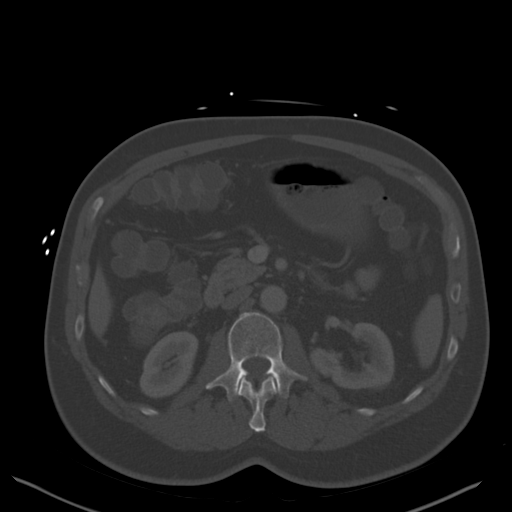
[im 77/96  soft-tissue]
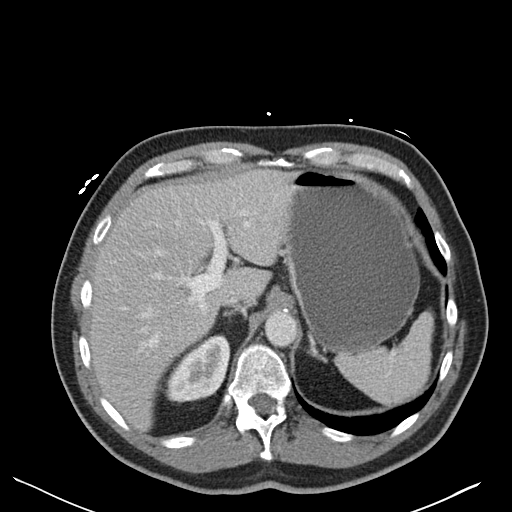
[im 83/96  soft-tissue]
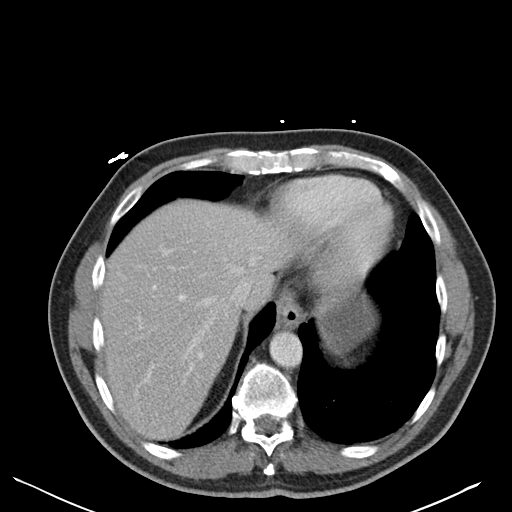
[im 89/96  soft-tissue]
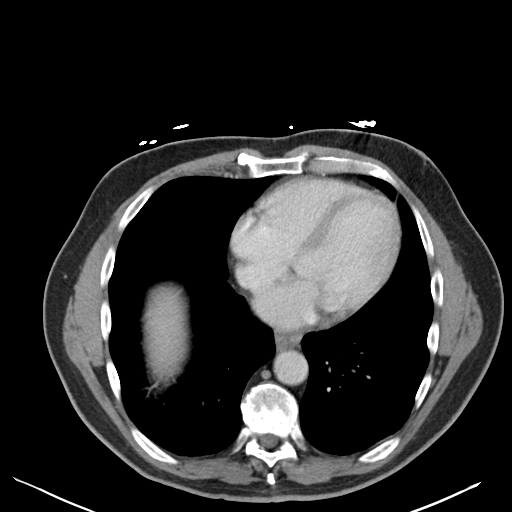

[Series 5: coronal st · coronal · 0.69mm/px · 3 of 152 slices shown]
[im 51/152  soft-tissue]
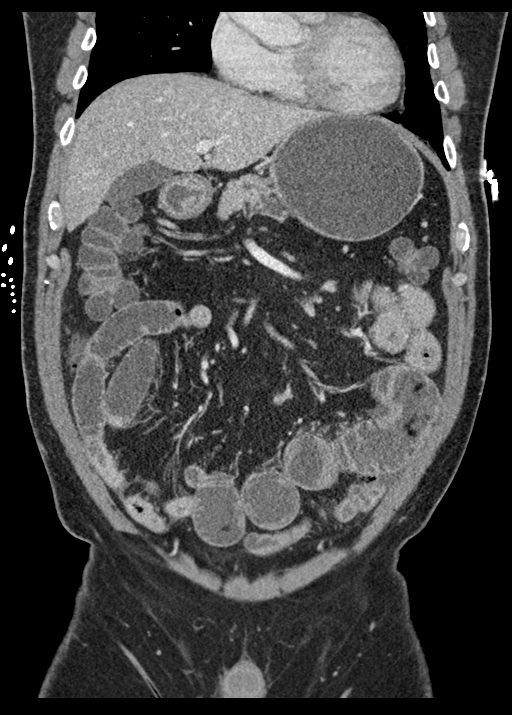
[im 68/152  soft-tissue]
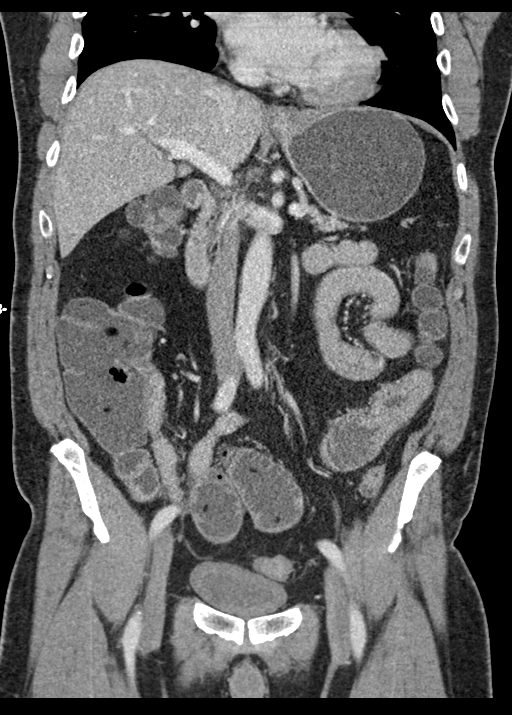
[im 84/152  soft-tissue]
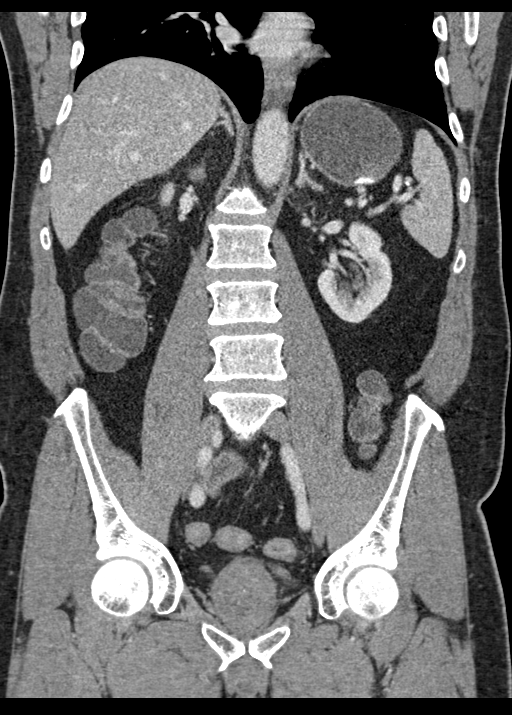

[15 of 46 positions shown; findings below may reference images not displayed]

FINDINGS: Lower chest: No acute abnormality.

Hepatobiliary: No solid liver abnormality is seen. No gallstones,
gallbladder wall thickening, or biliary dilatation.

Pancreas: Unremarkable. No pancreatic ductal dilatation or
surrounding inflammatory changes.

Spleen: Normal in size without significant abnormality.

Adrenals/Urinary Tract: Adrenal glands are unremarkable. Kidneys are
normal, without renal calculi, solid lesion, or hydronephrosis.
Thickening of the urinary bladder wall, likely due to chronic outlet
obstruction.

Stomach/Bowel: Stomach is within normal limits. Appendix is
surgically absent. There are multiple thickened, strictured
appearing segments of mid to distal bowel, in the ventral mid
abdomen measuring approximately 10 cm in length (series 3, image
38), in the right lower quadrant measuring approximately 5 cm in
length (series 3, image 45) and involving the terminal ileum
measuring at least 10 cm in length (series 5, image 66). There are
multiple fluid-filled, mildly distended loops of distal small bowel,
measuring up to 3.7 cm in caliber. The colon is fluid-filled to the
sigmoid and generally decompressed distally.

Vascular/Lymphatic: Aortic atherosclerosis. No enlarged abdominal or
pelvic lymph nodes.

Reproductive: Prostatomegaly.

Other: No abdominal wall hernia or abnormality. Small volume ascites
throughout the abdomen and pelvis, particularly in the right lower
quadrant.

Musculoskeletal: No acute or significant osseous findings.
IMPRESSION: 1. There are multiple thickened, strictured appearing segments of
mid to distal bowel as detailed above. This appearance is highly
suspicious for Crohn's disease.
2. There are multiple fluid-filled, mildly distended loops of distal
small bowel proximal to these strictured segments, measuring up to
3.7 cm in caliber and suggesting partial small bowel obstruction.
3. The colon is fluid-filled to the sigmoid, in keeping with
diarrheal illness.
4. Small volume ascites throughout the abdomen and pelvis,
particularly in the right lower quadrant.
5. Prostatomegaly with thickening of the urinary bladder wall,
likely due to chronic outlet obstruction.

Aortic Atherosclerosis ([id number]-PZ3.3).

## 2023-03-17 DIAGNOSIS — Z23 Encounter for immunization: Secondary | ICD-10-CM | POA: Diagnosis not present

## 2023-03-17 DIAGNOSIS — Z0000 Encounter for general adult medical examination without abnormal findings: Secondary | ICD-10-CM | POA: Diagnosis not present

## 2023-03-17 DIAGNOSIS — Z6831 Body mass index (BMI) 31.0-31.9, adult: Secondary | ICD-10-CM | POA: Diagnosis not present

## 2023-03-24 ENCOUNTER — Other Ambulatory Visit: Payer: Self-pay

## 2023-03-24 ENCOUNTER — Emergency Department (HOSPITAL_BASED_OUTPATIENT_CLINIC_OR_DEPARTMENT_OTHER)
Admission: EM | Admit: 2023-03-24 | Discharge: 2023-03-24 | Disposition: A | Discharge status: Home / Self Care | Payer: Medicare HMO | Attending: Emergency Medicine | Admitting: Emergency Medicine

## 2023-03-24 DIAGNOSIS — Z7982 Long term (current) use of aspirin: Secondary | ICD-10-CM | POA: Insufficient documentation

## 2023-03-24 DIAGNOSIS — R030 Elevated blood-pressure reading, without diagnosis of hypertension: Secondary | ICD-10-CM

## 2023-03-24 DIAGNOSIS — Z79899 Other long term (current) drug therapy: Secondary | ICD-10-CM | POA: Insufficient documentation

## 2023-03-24 DIAGNOSIS — I10 Essential (primary) hypertension: Secondary | ICD-10-CM | POA: Diagnosis not present

## 2023-03-24 LAB — CBG MONITORING, ED: Glucose-Capillary: 94 mg/dL (ref 70–99)

## 2023-03-24 LAB — BASIC METABOLIC PANEL
Anion gap: 9 (ref 5–15)
BUN: 13 mg/dL (ref 8–23)
CO2: 25 mmol/L (ref 22–32)
Calcium: 9.4 mg/dL (ref 8.9–10.3)
Chloride: 103 mmol/L (ref 98–111)
Creatinine, Ser: 1.05 mg/dL (ref 0.61–1.24)
GFR, Estimated: >60 mL/min (ref >60)
Glucose, Bld: 97 mg/dL (ref 70–99)
Potassium: 4.1 mmol/L (ref 3.5–5.1)
Sodium: 137 mmol/L (ref 135–145)

## 2023-03-24 LAB — CBC
HCT: 46.5 % (ref 39.0–52.0)
Hemoglobin: 15.8 g/dL (ref 13.0–17.0)
MCH: 30.6 pg (ref 26.0–34.0)
MCHC: 34 g/dL (ref 30.0–36.0)
MCV: 90.1 fL (ref 80.0–100.0)
Platelets: 163 10*3/uL (ref 150–400)
RBC: 5.16 MIL/uL (ref 4.22–5.81)
RDW: 12.7 % (ref 11.5–15.5)
WBC: 6.5 10*3/uL (ref 4.0–10.5)
nRBC: 0 % (ref 0.0–0.2)

## 2023-03-24 NOTE — ED Provider Notes (Signed)
[facility] EMERGENCY DEPARTMENT AT [facility] Provider Note   CSN: [id number] Arrival date & time: 03/24/23  0000     History  Chief Complaint  Patient presents with   Hypertension    Joel Orozco is a 73 y.o. male.  The history is provided by the patient.  Hypertension  He has a history of prostatic hypertrophy and comes in because of elevated blood pressure.  Over the last 3 days, he has been having intermittent bifrontal headaches which have generally been relieved with aspirin 162 mg.  He has also been checking his blood pressure and has noted that it has been getting higher.  He had an elevated blood pressure reading in his doctor's office last week, so had been checking his blood pressure at home.  Blood pressure got as high as 188/99 tonight which prompted him to come to the emergency department.  He denies any visual disturbance.  Denies any nausea or vomiting.  He is currently headache free.  He denies weakness, numbness, tingling.   Home Medications Prior to Admission medications   Medication Sig Start Date End Date Taking? Authorizing Provider  aspirin EC 81 MG tablet Take 81 mg by mouth daily.    [provider]  cetirizine (ZYRTEC) 10 MG tablet Take 10 mg by mouth daily.    [provider]  ibuprofen (ADVIL) 200 MG tablet Take 400 mg by mouth every 4 (four) hours as needed for headache or mild pain.    [provider]  Multiple Vitamins-Minerals (MULTIVITAMIN WITH MINERALS) tablet Take 1 tablet by mouth daily.    [provider]  ondansetron (ZOFRAN) 4 MG tablet Take 1 tablet (4 mg total) by mouth every 6 (six) hours as needed for nausea. 05/09/20   Marguerita Merles Latif, DO  tamsulosin (FLOMAX) 0.4 MG CAPS capsule Take 1 capsule (0.4 mg total) by mouth daily. 12/13/21   Lonell Grandchild, MD  Turmeric 500 MG CAPS Take 1 capsule by mouth daily.    [provider]      Allergies    Patient has no known allergies.     Review of Systems   Review of Systems  All other systems reviewed and are negative.   Physical Exam Updated Vital Signs BP (!) 141/74   Pulse 65   Temp (!) 97.5 F (36.4 C)   Resp 16   Ht 5\' 8"  (1.727 m)   Wt 93.4 kg   SpO2 99%   BMI 31.32 kg/m  Physical Exam Vitals and nursing note reviewed.   73 year old male, resting comfortably and in no acute distress. Vital signs are significant for borderline elevated blood pressure. Oxygen saturation is 99%, which is normal. Head is normocephalic and atraumatic. PERRLA, EOMI. Oropharynx is clear. Neck is nontender and supple without adenopathy or JVD.  There are no carotid bruits. Lungs are clear without rales, wheezes, or rhonchi. Chest is nontender. Heart has regular rate and rhythm without murmur. Abdomen is soft, flat, nontender. Extremities have no cyanosis or edema. Skin is warm and dry without rash. Neurologic: Mental status is normal, moves all extremities equally.  ED Results / Procedures / Treatments   Labs (all labs ordered are listed, but only abnormal results are displayed) Labs Reviewed  BASIC METABOLIC PANEL  CBC  CBG MONITORING, ED    EKG EKG Interpretation Date/Time:  Wednesday March 24 2023 00:12:29 EST Ventricular Rate:  72 PR Interval:  164 QRS Duration:  96 QT Interval:  400 QTC  Calculation: 438 R Axis:   -29  Text Interpretation: Normal sinus rhythm Anterior infarct , age undetermined Abnormal ECG No previous ECGs available Confirmed by Dione Booze ([id number]) on 03/24/2023 12:19:02 AM  Radiology No results found.  Procedures Procedures    Medications Ordered in ED Medications - No data to display  ED Course/ Medical Decision Making/ A&P                                 Medical Decision Making Amount and/or Complexity of Data Reviewed Labs: ordered.   Elevated blood pressure without diagnosis of hypertension.  His initial blood pressure at triage was moderately elevated, has come  down with simple observation.  This may indeed be labile hypertension versus whitecoat hypertension.  I had an extended discussion with the patient regarding how to monitor his blood pressure at home.  Laboratory tests were ordered at triage and I have reviewed them.  My interpretation is normal CBC, normal basic metabolic panel.  I have reviewed his ECG, and my interpretation is possible old anteroseptal myocardial infarction but no acute ST or T changes, no prior ECGs available for comparison.  I am recommending that he use acetaminophen as needed for his headaches.  I have recommended that he monitor his blood pressure by taking it once or twice a day and keeping a record of it.  I have discussed what to do if blood pressure is significantly elevated-recheck blood pressure 15-30 minutes later.  I am encouraging him to start a regular exercise program.  He needs to follow-up with his primary care provider.  Final Clinical Impression(s) / ED Diagnoses Final diagnoses:  Elevated blood pressure reading without diagnosis of hypertension    Rx / DC Orders ED Discharge Orders     None         Dione Booze, MD 03/24/23 [phone]

## 2023-03-24 NOTE — ED Triage Notes (Signed)
Pt POV from home reporting BP 199/75. Reports headache and dizziness, no BP meds.

## 2023-03-24 NOTE — Discharge Instructions (Addendum)
Please check your blood pressure once or twice a day.  Keep a record of it.  Take that record with you whenever you see your primary care provider.  If you ever see a blood pressure reading that is worrisome to you, recheck it 15-30 minutes later after you have done something to help relax you.  Try to get on a regular exercise schedule.

## 2023-08-04 DIAGNOSIS — D122 Benign neoplasm of ascending colon: Secondary | ICD-10-CM | POA: Diagnosis not present

## 2023-08-06 DIAGNOSIS — D122 Benign neoplasm of ascending colon: Secondary | ICD-10-CM | POA: Diagnosis not present

## 2023-09-01 DIAGNOSIS — H524 Presbyopia: Secondary | ICD-10-CM | POA: Diagnosis not present

## 2023-09-13 DIAGNOSIS — R972 Elevated prostate specific antigen [PSA]: Secondary | ICD-10-CM | POA: Diagnosis not present

## 2023-09-13 DIAGNOSIS — Z6831 Body mass index (BMI) 31.0-31.9, adult: Secondary | ICD-10-CM | POA: Diagnosis not present

## 2023-09-13 DIAGNOSIS — E785 Hyperlipidemia, unspecified: Secondary | ICD-10-CM | POA: Diagnosis not present

## 2023-09-13 DIAGNOSIS — R739 Hyperglycemia, unspecified: Secondary | ICD-10-CM | POA: Diagnosis not present

## 2023-09-13 DIAGNOSIS — L819 Disorder of pigmentation, unspecified: Secondary | ICD-10-CM | POA: Diagnosis not present

## 2023-09-13 DIAGNOSIS — R030 Elevated blood-pressure reading, without diagnosis of hypertension: Secondary | ICD-10-CM | POA: Diagnosis not present

## 2024-02-07 DIAGNOSIS — D485 Neoplasm of uncertain behavior of skin: Secondary | ICD-10-CM | POA: Diagnosis not present

## 2024-02-07 DIAGNOSIS — L570 Actinic keratosis: Secondary | ICD-10-CM | POA: Diagnosis not present

## 2024-02-07 DIAGNOSIS — L821 Other seborrheic keratosis: Secondary | ICD-10-CM | POA: Diagnosis not present

## 2024-02-07 DIAGNOSIS — L814 Other melanin hyperpigmentation: Secondary | ICD-10-CM | POA: Diagnosis not present

## 2024-02-07 DIAGNOSIS — D225 Melanocytic nevi of trunk: Secondary | ICD-10-CM | POA: Diagnosis not present

## 2024-02-07 DIAGNOSIS — L910 Hypertrophic scar: Secondary | ICD-10-CM | POA: Diagnosis not present

## 2024-03-31 ENCOUNTER — Other Ambulatory Visit (HOSPITAL_COMMUNITY): Payer: Self-pay | Admitting: Physician Assistant

## 2024-03-31 DIAGNOSIS — E785 Hyperlipidemia, unspecified: Secondary | ICD-10-CM

## 2024-04-18 ENCOUNTER — Other Ambulatory Visit (HOSPITAL_BASED_OUTPATIENT_CLINIC_OR_DEPARTMENT_OTHER)
